# Patient Record
Sex: Female | Born: 1995 | Race: Black or African American | Hispanic: No | Marital: Single | Smoking: Current every day smoker
Health system: Southern US, Community
[De-identification: ages and names within clinical notes are randomized; demographics above are authoritative.]

## PROBLEM LIST (undated history)

## (undated) DIAGNOSIS — Z789 Other specified health status: Secondary | ICD-10-CM

## (undated) DIAGNOSIS — J45909 Unspecified asthma, uncomplicated: Secondary | ICD-10-CM

## (undated) DIAGNOSIS — F419 Anxiety disorder, unspecified: Secondary | ICD-10-CM

## (undated) HISTORY — PX: NO PAST SURGERIES: SHX2092

---
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
## 2014-07-11 ENCOUNTER — Encounter (HOSPITAL_COMMUNITY): Payer: Self-pay | Admitting: *Deleted

## 2014-07-11 ENCOUNTER — Inpatient Hospital Stay (HOSPITAL_COMMUNITY)
Admission: AD | Admit: 2014-07-11 | Discharge: 2014-07-11 | Disposition: A | Discharge status: Home / Self Care | Payer: Self-pay | Source: Ambulatory Visit | Attending: Obstetrics & Gynecology | Admitting: Obstetrics & Gynecology

## 2014-07-11 DIAGNOSIS — N926 Irregular menstruation, unspecified: Secondary | ICD-10-CM | POA: Insufficient documentation

## 2014-07-11 DIAGNOSIS — R109 Unspecified abdominal pain: Secondary | ICD-10-CM

## 2014-07-11 DIAGNOSIS — R52 Pain, unspecified: Secondary | ICD-10-CM | POA: Insufficient documentation

## 2014-07-11 HISTORY — DX: Other specified health status: Z78.9

## 2014-07-11 LAB — URINE MICROSCOPIC-ADD ON

## 2014-07-11 LAB — URINALYSIS, ROUTINE W REFLEX MICROSCOPIC
Bilirubin Urine: NEGATIVE
GLUCOSE, UA: NEGATIVE mg/dL
Ketones, ur: 15 mg/dL — AB
LEUKOCYTES UA: NEGATIVE
Nitrite: NEGATIVE
PH: 6 (ref 5.0–8.0)
PROTEIN: NEGATIVE mg/dL
SPECIFIC GRAVITY, URINE: 1.02 (ref 1.005–1.030)
Urobilinogen, UA: 1 mg/dL (ref 0.0–1.0)

## 2014-07-11 LAB — CBC
HCT: 34.5 % — ABNORMAL LOW (ref 36.0–46.0)
Hemoglobin: 11.5 g/dL — ABNORMAL LOW (ref 12.0–15.0)
MCH: 27.6 pg (ref 26.0–34.0)
MCHC: 33.3 g/dL (ref 30.0–36.0)
MCV: 82.7 fL (ref 78.0–100.0)
PLATELETS: 310 10*3/uL (ref 150–400)
RBC: 4.17 MIL/uL (ref 3.87–5.11)
RDW: 12.2 % (ref 11.5–15.5)
WBC: 5.5 10*3/uL (ref 4.0–10.5)

## 2014-07-11 LAB — WET PREP, GENITAL
Trich, Wet Prep: NONE SEEN
Yeast Wet Prep HPF POC: NONE SEEN

## 2014-07-11 LAB — POCT PREGNANCY, URINE: PREG TEST UR: NEGATIVE

## 2014-07-11 LAB — HCG, SERUM, QUALITATIVE: Preg, Serum: NEGATIVE

## 2014-07-11 MED ORDER — CYCLOBENZAPRINE HCL 10 MG PO TABS
10.0000 mg | ORAL_TABLET | Freq: Once | ORAL | Status: AC
  Administered 2014-07-11: 10 mg via ORAL
  Filled 2014-07-11: qty 1

## 2014-07-11 MED ORDER — GI COCKTAIL ~~LOC~~
30.0000 mL | Freq: Once | ORAL | Status: AC
  Administered 2014-07-11: 30 mL via ORAL
  Filled 2014-07-11: qty 30

## 2014-07-11 MED ORDER — ACETAMINOPHEN 500 MG PO TABS
1000.0000 mg | ORAL_TABLET | Freq: Once | ORAL | Status: AC
  Administered 2014-07-11: 1000 mg via ORAL
  Filled 2014-07-11: qty 2

## 2014-07-11 NOTE — MAU Provider Note (Signed)
History     CSN: [id number]  Arrival date and time: 07/11/14 1153   None     Chief Complaint  Patient presents with  . Possible Pregnancy  . Generalized Body Aches   HPI  RN Garvin Fila, RN Registered Nurse Signed Nursing MAU Note Service date: 07/11/2014 12:30 PM   Patient states she has a history of irregular periods. States she has had negative pregnancy tests at home. Has had lower/mid back pain for 2 days. Has had symptoms of pregnancy over the past few weeks. Denies abdominal pain, bleeding or discharge  Pt is not pregnant and is not using anything for birth control but patient says she is using condoms. Pt has history of irregular periods.  Pt says her abd has been feeling distended and breasts increasing in size. Pt denies constipation or diarrhea or UTI symptoms.   No past medical history on file.  No past surgical history on file.  No family history on file.  History  Substance Use Topics  . Smoking status: Not on file  . Smokeless tobacco: Not on file  . Alcohol Use: Not on file    Allergies: Allergies not on file  No prescriptions prior to admission    Review of Systems  Constitutional: Positive for chills. Negative for fever.  Gastrointestinal: Positive for abdominal pain. Negative for diarrhea and constipation.  Genitourinary: Negative for dysuria, urgency and frequency.  Musculoskeletal: Positive for back pain.   Physical Exam   Blood pressure 130/69, pulse 82, temperature 99.5 F (37.5 C), temperature source Oral, resp. rate 16, height 5\' 3"  (1.6 m), weight 173 lb 6.4 oz (78.654 kg), last menstrual period 07/02/2014, SpO2 99.00%.  Physical Exam  Nursing note and vitals reviewed. Constitutional: She is oriented to person, place, and time. She appears well-developed and well-nourished. No distress.  HENT:  Head: Normocephalic.  Eyes: Pupils are equal, round, and reactive to light.  Neck: Normal range of motion. Neck supple.  Cardiovascular:  Normal rate.   Respiratory: Effort normal.  GI: Soft.  Genitourinary:  Small amount of whtie frothy discharge in vault; uterus NSSC NT; adnexa diffusely tender  Musculoskeletal: Normal range of motion.  Neurological: She is alert and oriented to person, place, and time.  Skin: Skin is warm and dry.  Psychiatric: She has a normal mood and affect.    MAU Course  Procedures Results for orders placed during the hospital encounter of 07/11/14 (from the past 24 hour(s))  URINALYSIS, ROUTINE W REFLEX MICROSCOPIC     Status: Abnormal   Collection Time    07/11/14 12:20 PM      Result Value Ref Range   Color, Urine YELLOW  YELLOW   APPearance CLEAR  CLEAR   Specific Gravity, Urine 1.020  1.005 - 1.030   pH 6.0  5.0 - 8.0   Glucose, UA NEGATIVE  NEGATIVE mg/dL   Hgb urine dipstick TRACE (*) NEGATIVE   Bilirubin Urine NEGATIVE  NEGATIVE   Ketones, ur 15 (*) NEGATIVE mg/dL   Protein, ur NEGATIVE  NEGATIVE mg/dL   Urobilinogen, UA 1.0  0.0 - 1.0 mg/dL   Nitrite NEGATIVE  NEGATIVE   Leukocytes, UA NEGATIVE  NEGATIVE  URINE MICROSCOPIC-ADD ON     Status: Abnormal   Collection Time    07/11/14 12:20 PM      Result Value Ref Range   Squamous Epithelial / LPF FEW (*) RARE   WBC, UA 0-2  <3 WBC/hpf   RBC / HPF 3-6  <  3 RBC/hpf   Bacteria, UA FEW (*) RARE   Urine-Other MUCOUS PRESENT    POCT PREGNANCY, URINE     Status: None   Collection Time    07/11/14  1:00 PM      Result Value Ref Range   Preg Test, Ur NEGATIVE  NEGATIVE  CBC     Status: Abnormal   Collection Time    07/11/14  3:43 PM      Result Value Ref Range   WBC 5.5  4.0 - 10.5 K/uL   RBC 4.17  3.87 - 5.11 MIL/uL   Hemoglobin 11.5 (*) 12.0 - 15.0 g/dL   HCT 96.034.5 (*) 45.436.0 - 09.846.0 %   MCV 82.7  78.0 - 100.0 fL   MCH 27.6  26.0 - 34.0 pg   MCHC 33.3  30.0 - 36.0 g/dL   RDW 11.912.2  14.711.5 - 82.915.5 %   Platelets 310  150 - 400 K/uL  HCG, SERUM, QUALITATIVE     Status: None   Collection Time    07/11/14  3:43 PM      Result Value  Ref Range   Preg, Serum NEGATIVE  NEGATIVE  WET PREP, GENITAL     Status: Abnormal   Collection Time    07/11/14  5:38 PM      Result Value Ref Range   Yeast Wet Prep HPF POC NONE SEEN  NONE SEEN   Trich, Wet Prep NONE SEEN  NONE SEEN   Clue Cells Wet Prep HPF POC FEW (*) NONE SEEN   WBC, Wet Prep HPF POC FEW (*) NONE SEEN     Assessment and Plan  Abdominal pain- d/c home F/u in [facility] for irregular menses  Sonya Lindsey 07/11/2014, 1:15 PM

## 2014-07-11 NOTE — Discharge Instructions (Signed)
Abdominal Pain, Women °Abdominal (stomach, pelvic, or belly) pain can be caused by many things. It is important to tell your doctor: °· The location of the pain. °· Does it come and go or is it present all the time? °· Are there things that start the pain (eating certain foods, exercise)? °· Are there other symptoms associated with the pain (fever, nausea, vomiting, diarrhea)? °All of this is helpful to know when trying to find the cause of the pain. °CAUSES  °· Stomach: virus or bacteria infection, or ulcer. °· Intestine: appendicitis (inflamed appendix), regional ileitis (Crohn's disease), ulcerative colitis (inflamed colon), irritable bowel syndrome, diverticulitis (inflamed diverticulum of the colon), or cancer of the stomach or intestine. °· Gallbladder disease or stones in the gallbladder. °· Kidney disease, kidney stones, or infection. °· Pancreas infection or cancer. °· Fibromyalgia (pain disorder). °· Diseases of the female organs: °¨ Uterus: fibroid (non-cancerous) tumors or infection. °¨ Fallopian tubes: infection or tubal pregnancy. °¨ Ovary: cysts or tumors. °¨ Pelvic adhesions (scar tissue). °¨ Endometriosis (uterus lining tissue growing in the pelvis and on the pelvic organs). °¨ Pelvic congestion syndrome (female organs filling up with blood just before the menstrual period). °¨ Pain with the menstrual period. °¨ Pain with ovulation (producing an egg). °¨ Pain with an IUD (intrauterine device, birth control) in the uterus. °¨ Cancer of the female organs. °· Functional pain (pain not caused by a disease, may improve without treatment). °· Psychological pain. °· Depression. °DIAGNOSIS  °Your doctor will decide the seriousness of your pain by doing an examination. °· Blood tests. °· X-rays. °· Ultrasound. °· CT scan (computed tomography, special type of X-ray). °· MRI (magnetic resonance imaging). °· Cultures, for infection. °· Barium enema (dye inserted in the large intestine, to better view it with  X-rays). °· Colonoscopy (looking in intestine with a lighted tube). °· Laparoscopy (minor surgery, looking in abdomen with a lighted tube). °· Major abdominal exploratory surgery (looking in abdomen with a large incision). °TREATMENT  °The treatment will depend on the cause of the pain.  °· Many cases can be observed and treated at home. °· Over-the-counter medicines recommended by your caregiver. °· Prescription medicine. °· Antibiotics, for infection. °· Birth control pills, for painful periods or for ovulation pain. °· Hormone treatment, for endometriosis. °· Nerve blocking injections. °· Physical therapy. °· Antidepressants. °· Counseling with a psychologist or psychiatrist. °· Minor or major surgery. °HOME CARE INSTRUCTIONS  °· Do not take laxatives, unless directed by your caregiver. °· Take over-the-counter pain medicine only if ordered by your caregiver. Do not take aspirin because it can cause an upset stomach or bleeding. °· Try a clear liquid diet (broth or water) as ordered by your caregiver. Slowly move to a bland diet, as tolerated, if the pain is related to the stomach or intestine. °· Have a thermometer and take your temperature several times a day, and record it. °· Bed rest and sleep, if it helps the pain. °· Avoid sexual intercourse, if it causes pain. °· Avoid stressful situations. °· Keep your follow-up appointments and tests, as your caregiver orders. °· If the pain does not go away with medicine or surgery, you may try: °¨ Acupuncture. °¨ Relaxation exercises (yoga, meditation). °¨ Group therapy. °¨ Counseling. °SEEK MEDICAL CARE IF:  °· You notice certain foods cause stomach pain. °· Your home care treatment is not helping your pain. °· You need stronger pain medicine. °· You want your IUD removed. °· You feel faint or   lightheaded.  You develop nausea and vomiting.  You develop a rash.  You are having side effects or an allergy to your medicine. SEEK IMMEDIATE MEDICAL CARE IF:   Your  pain does not go away or gets worse.  You have a fever.  Your pain is felt only in portions of the abdomen. The right side could possibly be appendicitis. The left lower portion of the abdomen could be colitis or diverticulitis.  You are passing blood in your stools (bright red or black tarry stools, with or without vomiting).  You have blood in your urine.  You develop chills, with or without a fever.  You pass out. MAKE SURE YOU:   Understand these instructions.  Will watch your condition.  Will get help right away if you are not doing well or get worse. Document Released: 09/19/2007 Document Revised: 04/08/2014 Document Reviewed: 10/09/2009 [facility] Patient Information 2015 [place], [place]. This information is not intended to replace advice given to you by your health care provider. Make sure you discuss any questions you have with your health care provider.  Abdominal Pain, Women Abdominal (stomach, pelvic, or belly) pain can be caused by many things. It is important to tell your doctor:  The location of the pain.  Does it come and go or is it present all the time?  Are there things that start the pain (eating certain foods, exercise)?  Are there other symptoms associated with the pain (fever, nausea, vomiting, diarrhea)? All of this is helpful to know when trying to find the cause of the pain. CAUSES   Stomach: virus or bacteria infection, or ulcer.  Intestine: appendicitis (inflamed appendix), regional ileitis (Crohn's disease), ulcerative colitis (inflamed colon), irritable bowel syndrome, diverticulitis (inflamed diverticulum of the colon), or cancer of the stomach or intestine.  Gallbladder disease or stones in the gallbladder.  Kidney disease, kidney stones, or infection.  Pancreas infection or cancer.  Fibromyalgia (pain disorder).  Diseases of the female organs:  Uterus: fibroid (non-cancerous) tumors or infection.  Fallopian tubes: infection or tubal  pregnancy.  Ovary: cysts or tumors.  Pelvic adhesions (scar tissue).  Endometriosis (uterus lining tissue growing in the pelvis and on the pelvic organs).  Pelvic congestion syndrome (female organs filling up with blood just before the menstrual period).  Pain with the menstrual period.  Pain with ovulation (producing an egg).  Pain with an IUD (intrauterine device, birth control) in the uterus.  Cancer of the female organs.  Functional pain (pain not caused by a disease, may improve without treatment).  Psychological pain.  Depression. DIAGNOSIS  Your doctor will decide the seriousness of your pain by doing an examination.  Blood tests.  X-rays.  Ultrasound.  CT scan (computed tomography, special type of X-ray).  MRI (magnetic resonance imaging).  Cultures, for infection.  Barium enema (dye inserted in the large intestine, to better view it with X-rays).  Colonoscopy (looking in intestine with a lighted tube).  Laparoscopy (minor surgery, looking in abdomen with a lighted tube).  Major abdominal exploratory surgery (looking in abdomen with a large incision). TREATMENT  The treatment will depend on the cause of the pain.   Many cases can be observed and treated at home.  Over-the-counter medicines recommended by your caregiver.  Prescription medicine.  Antibiotics, for infection.  Birth control pills, for painful periods or for ovulation pain.  Hormone treatment, for endometriosis.  Nerve blocking injections.  Physical therapy.  Antidepressants.  Counseling with a psychologist or psychiatrist.  Minor or  major surgery. °HOME CARE INSTRUCTIONS  °· Do not take laxatives, unless directed by your caregiver. °· Take over-the-counter pain medicine only if ordered by your caregiver. Do not take aspirin because it can cause an upset stomach or bleeding. °· Try a clear liquid diet (broth or water) as ordered by your caregiver. Slowly move to a bland diet, as  tolerated, if the pain is related to the stomach or intestine. °· Have a thermometer and take your temperature several times a day, and record it. °· Bed rest and sleep, if it helps the pain. °· Avoid sexual intercourse, if it causes pain. °· Avoid stressful situations. °· Keep your follow-up appointments and tests, as your caregiver orders. °· If the pain does not go away with medicine or surgery, you may try: °¨ Acupuncture. °¨ Relaxation exercises (yoga, meditation). °¨ Group therapy. °¨ Counseling. °SEEK MEDICAL CARE IF:  °· You notice certain foods cause stomach pain. °· Your home care treatment is not helping your pain. °· You need stronger pain medicine. °· You want your IUD removed. °· You feel faint or lightheaded. °· You develop nausea and vomiting. °· You develop a rash. °· You are having side effects or an allergy to your medicine. °SEEK IMMEDIATE MEDICAL CARE IF:  °· Your pain does not go away or gets worse. °· You have a fever. °· Your pain is felt only in portions of the abdomen. The right side could possibly be appendicitis. The left lower portion of the abdomen could be colitis or diverticulitis. °· You are passing blood in your stools (bright red or black tarry stools, with or without vomiting). °· You have blood in your urine. °· You develop chills, with or without a fever. °· You pass out. °MAKE SURE YOU:  °· Understand these instructions. °· Will watch your condition. °· Will get help right away if you are not doing well or get worse. °Document Released: 09/19/2007 Document Revised: 04/08/2014 Document Reviewed: 10/09/2009 °[employer]® Patient Information ©2015 [employer]. This information is not intended to replace advice given to you by your health care provider. Make sure you discuss any questions you have with your health care provider. ° °

## 2014-07-11 NOTE — MAU Note (Signed)
Patient states she has a history of irregular periods. States she has had negative pregnancy tests at home. Has had lower/mid back pain for 2 days. Has had symptoms of pregnancy over the past few weeks. Denies abdominal pain, bleeding or discharge.

## 2014-07-12 LAB — GC/CHLAMYDIA PROBE AMP
CT Probe RNA: NEGATIVE
GC PROBE AMP APTIMA: NEGATIVE

## 2014-07-12 LAB — RPR

## 2014-08-01 ENCOUNTER — Encounter (HOSPITAL_COMMUNITY): Payer: Self-pay | Admitting: Emergency Medicine

## 2014-08-01 ENCOUNTER — Emergency Department (HOSPITAL_COMMUNITY)
Admission: EM | Admit: 2014-08-01 | Discharge: 2014-08-01 | Disposition: A | Discharge status: Home / Self Care | Payer: Self-pay | Attending: Emergency Medicine | Admitting: Emergency Medicine

## 2014-08-01 DIAGNOSIS — F17200 Nicotine dependence, unspecified, uncomplicated: Secondary | ICD-10-CM | POA: Insufficient documentation

## 2014-08-01 DIAGNOSIS — Z79899 Other long term (current) drug therapy: Secondary | ICD-10-CM | POA: Insufficient documentation

## 2014-08-01 DIAGNOSIS — Z3202 Encounter for pregnancy test, result negative: Secondary | ICD-10-CM | POA: Insufficient documentation

## 2014-08-01 DIAGNOSIS — R109 Unspecified abdominal pain: Secondary | ICD-10-CM | POA: Insufficient documentation

## 2014-08-01 LAB — URINALYSIS, ROUTINE W REFLEX MICROSCOPIC
Bilirubin Urine: NEGATIVE
GLUCOSE, UA: NEGATIVE mg/dL
HGB URINE DIPSTICK: NEGATIVE
Ketones, ur: NEGATIVE mg/dL
Leukocytes, UA: NEGATIVE
Nitrite: NEGATIVE
PH: 5.5 (ref 5.0–8.0)
Protein, ur: NEGATIVE mg/dL
SPECIFIC GRAVITY, URINE: 1.016 (ref 1.005–1.030)
UROBILINOGEN UA: 0.2 mg/dL (ref 0.0–1.0)

## 2014-08-01 LAB — CBC WITH DIFFERENTIAL/PLATELET
BASOS ABS: 0 10*3/uL (ref 0.0–0.1)
BASOS PCT: 1 % (ref 0–1)
Eosinophils Absolute: 0.3 10*3/uL (ref 0.0–0.7)
Eosinophils Relative: 6 % — ABNORMAL HIGH (ref 0–5)
HCT: 35.6 % — ABNORMAL LOW (ref 36.0–46.0)
HEMOGLOBIN: 11.7 g/dL — AB (ref 12.0–15.0)
LYMPHS PCT: 55 % — AB (ref 12–46)
Lymphs Abs: 2.7 10*3/uL (ref 0.7–4.0)
MCH: 27.5 pg (ref 26.0–34.0)
MCHC: 32.9 g/dL (ref 30.0–36.0)
MCV: 83.8 fL (ref 78.0–100.0)
Monocytes Absolute: 0.3 10*3/uL (ref 0.1–1.0)
Monocytes Relative: 6 % (ref 3–12)
NEUTROS ABS: 1.6 10*3/uL — AB (ref 1.7–7.7)
NEUTROS PCT: 32 % — AB (ref 43–77)
Platelets: 329 10*3/uL (ref 150–400)
RBC: 4.25 MIL/uL (ref 3.87–5.11)
RDW: 12.3 % (ref 11.5–15.5)
WBC: 4.9 10*3/uL (ref 4.0–10.5)

## 2014-08-01 LAB — COMPREHENSIVE METABOLIC PANEL
ALBUMIN: 3.6 g/dL (ref 3.5–5.2)
ALK PHOS: 78 U/L (ref 39–117)
ALT: 15 U/L (ref 0–35)
AST: 18 U/L (ref 0–37)
Anion gap: 13 (ref 5–15)
BILIRUBIN TOTAL: 0.6 mg/dL (ref 0.3–1.2)
BUN: 11 mg/dL (ref 6–23)
CHLORIDE: 102 meq/L (ref 96–112)
CO2: 26 meq/L (ref 19–32)
Calcium: 9 mg/dL (ref 8.4–10.5)
Creatinine, Ser: 0.74 mg/dL (ref 0.50–1.10)
GFR calc Af Amer: >90 mL/min (ref >90)
GFR calc non Af Amer: >90 mL/min (ref >90)
Glucose, Bld: 96 mg/dL (ref 70–99)
POTASSIUM: 3.8 meq/L (ref 3.7–5.3)
SODIUM: 141 meq/L (ref 137–147)
Total Protein: 7.3 g/dL (ref 6.0–8.3)

## 2014-08-01 LAB — LIPASE, BLOOD: LIPASE: 21 U/L (ref 11–59)

## 2014-08-01 LAB — POC URINE PREG, ED: Preg Test, Ur: NEGATIVE

## 2014-08-01 NOTE — ED Provider Notes (Signed)
CSN: [id number]     Arrival date & time 08/01/14  0234 History   First MD Initiated Contact with Patient 08/01/14 [phone]     Chief Complaint  Patient presents with  . Abdominal Pain     (Consider location/radiation/quality/duration/timing/severity/associated sxs/prior Treatment) HPI Comments: 18 year old female who is otherwise healthy, no past surgical history presents with a complaint of abdominal distention and feeling "heavy". She denies abdominal pain, denies nausea vomiting diarrhea dysuria rectal bleeding or hematuria. She states that approximately 2 months ago she started to notice that her abdomen felt "heavy". This evening when she awoke she felt that it was more significant than usual but again denies that this is a painful sensation. Nothing makes this better or worse, there is no pain related to eating  Patient is a 18 y.o. female presenting with abdominal pain. The history is provided by the patient.  Abdominal Pain   Past Medical History  Diagnosis Date  . Medical history non-contributory    Past Surgical History  Procedure Laterality Date  . No past surgeries     History reviewed. No pertinent family history. History  Substance Use Topics  . Smoking status: Current Every Day Smoker -- 0.25 packs/day  . Smokeless tobacco: Not on file  . Alcohol Use: No   OB History   Grav Para Term Preterm Abortions TAB SAB Ect Mult Living   0         0     Review of Systems  Gastrointestinal: Positive for abdominal pain.  All other systems reviewed and are negative.     Allergies  Review of patient's allergies indicates no known allergies.  Home Medications   Prior to Admission medications   Medication Sig Start Date End Date Taking? Authorizing Provider  folic acid (FOLVITE) 400 MCG tablet Take 400 mcg by mouth daily.   Yes Historical Provider, MD  Prenatal Vit-Fe Fumarate-FA (PRENATAL MULTIVITAMIN) TABS tablet Take 1 tablet by mouth daily at 12 noon.   Yes Historical  Provider, MD   BP 129/73  Pulse 66  Resp 19  SpO2 100%  LMP 07/02/2014 Physical Exam  Nursing note and vitals reviewed. Constitutional: She appears well-developed and well-nourished. No distress.  HENT:  Head: Normocephalic and atraumatic.  Mouth/Throat: Oropharynx is clear and moist. No oropharyngeal exudate.  Eyes: Conjunctivae and EOM are normal. Pupils are equal, round, and reactive to light. Right eye exhibits no discharge. Left eye exhibits no discharge. No scleral icterus.  Neck: Normal range of motion. Neck supple. No JVD present. No thyromegaly present.  Cardiovascular: Normal rate, regular rhythm, normal heart sounds and intact distal pulses.  Exam reveals no gallop and no friction rub.   No murmur heard. Pulmonary/Chest: Effort normal and breath sounds normal. No respiratory distress. She has no wheezes. She has no rales.  Abdominal: Soft. Bowel sounds are normal. She exhibits no distension and no mass. There is no tenderness.  Musculoskeletal: Normal range of motion. She exhibits no edema and no tenderness.  Lymphadenopathy:    She has no cervical adenopathy.  Neurological: She is alert. Coordination normal.  Skin: Skin is warm and dry. No rash noted. No erythema.  Psychiatric: She has a normal mood and affect. Her behavior is normal.    ED Course  Procedures (including critical care time) Labs Review Labs Reviewed  CBC WITH DIFFERENTIAL - Abnormal; Notable for the following:    Hemoglobin 11.7 (*)    HCT 35.6 (*)    Neutrophils Relative % 32 (*)  Neutro Abs 1.6 (*)    Lymphocytes Relative 55 (*)    Eosinophils Relative 6 (*)    All other components within normal limits  URINALYSIS, ROUTINE W REFLEX MICROSCOPIC - Abnormal; Notable for the following:    APPearance HAZY (*)    All other components within normal limits  COMPREHENSIVE METABOLIC PANEL  LIPASE, BLOOD  POC URINE PREG, ED    Imaging Review No results found.    MDM   Final diagnoses:   Abdominal discomfort    The patient has a completely nontender and benign abdomen, she is overweight but her abdomen is very soft, nonfocal, nontender and there are no masses. She has no tympanitic sounds to percussion, normal bowel sounds and with a normal appetite I doubt that this is related to a bowel obstruction, there is no ascites, doubt appendicitis, kidney stone and without other GI symptoms doubt inflammatory bowel disease. Nursing had ordered labs, anticipate discharge.  Laboratory workup is completely normal without signs of transaminitis, pancreatitis or leukocytosis. Not pregnant, and a urine infection, stable for discharge  Vida Roller, MD 08/01/14 [phone]

## 2014-08-01 NOTE — ED Notes (Signed)
Pt monitored by pulse ox, bp cuff, and 5-lead.  Pt monitored by 5-lead because pt states that she has been short of breath recently.

## 2014-08-01 NOTE — ED Notes (Signed)
Patient reports having abdominal pain in right side for over a month.  She is concerned she is pregnant. No nausea or vomiting. No diarrhea, but complains of constipation.

## 2014-08-01 NOTE — Discharge Instructions (Signed)
Abdominal Pain °Many things can cause abdominal pain. Usually, abdominal pain is not caused by a disease and will improve without treatment. It can often be observed and treated at home. Your health care provider will do a physical exam and possibly order blood tests and X-rays to help determine the seriousness of your pain. However, in many cases, more time must pass before a clear cause of the pain can be found. Before that point, your health care provider may not know if you need more testing or further treatment. °HOME CARE INSTRUCTIONS  °Monitor your abdominal pain for any changes. The following actions may help to alleviate any discomfort you are experiencing: °· Only take over-the-counter or prescription medicines as directed by your health care provider. °· Do not take laxatives unless directed to do so by your health care provider. °· Try a clear liquid diet (broth, tea, or water) as directed by your health care provider. Slowly move to a bland diet as tolerated. °SEEK MEDICAL CARE IF: °· You have unexplained abdominal pain. °· You have abdominal pain associated with nausea or diarrhea. °· You have pain when you urinate or have a bowel movement. °· You experience abdominal pain that wakes you in the night. °· You have abdominal pain that is worsened or improved by eating food. °· You have abdominal pain that is worsened with eating fatty foods. °· You have a fever. °SEEK IMMEDIATE MEDICAL CARE IF:  °· Your pain does not go away within 2 hours. °· You keep throwing up (vomiting). °· Your pain is felt only in portions of the abdomen, such as the right side or the left lower portion of the abdomen. °· You pass bloody or black tarry stools. °MAKE SURE YOU: °· Understand these instructions.   °· Will watch your condition.   °· Will get help right away if you are not doing well or get worse.   °Document Released: 09/01/2005 Document Revised: 11/27/2013 Document Reviewed: 08/01/2013 °ExitCare® Patient Information  ©2015 Exit[employer], LLC. This information is not intended to replace advice given to you by your health care provider. Make sure you discuss any questions you have with your health care provider. ° ° °Emergency Department Resource Guide °1) Find a Doctor and Pay Out of Pocket °Although you won't have to find out who is covered by your insurance plan, it is a good idea to ask around and get recommendations. You will then need to call the office and see if the doctor you have chosen will accept you as a new patient and what types of options they offer for patients who are self-pay. Some doctors offer discounts or will set up payment plans for their patients who do not have insurance, but you will need to ask so you aren't surprised when you get to your appointment. ° °2) Contact Your Local Health Department °Not all health departments have doctors that can see patients for sick visits, but many do, so it is worth a call to see if yours does. If you don't know where your local health department is, you can check in your phone book. The CDC also has a tool to help you locate your state's health department, and many state websites also have listings of all of their local health departments. ° °3) Find a Walk-in Clinic °If your illness is not likely to be very severe or complicated, you may want to try a walk in clinic. These are popping up all over the country in pharmacies, drugstores, and shopping centers. They're   usually staffed by nurse practitioners or physician assistants that have been trained to treat common illnesses and complaints. They're usually fairly quick and inexpensive. However, if you have serious medical issues or chronic medical problems, these are probably not your best option. ° °No Primary Care Doctor: °- Call [facility] at  832-8000 - they can help you locate a primary care doctor that  accepts your insurance, provides certain services, etc. °- Physician Referral Service- 1-800-533-3463 ° °Chronic  Pain Problems: °Organization         Address  Phone   Notes  °[facility]  (336) 297-2271 Patients need to be referred by their primary care doctor.  ° °Medication Assistance: °Organization         Address  Phone   Notes  °[place] [facility] [address] °[place], [place] [address] (336) 641-8030 --Must be a resident of [place] °-- Must have NO insurance coverage whatsoever (no Medicaid/ Medicare, etc.) °-- The pt. MUST have a primary care doctor that directs their care regularly and follows them in the community °  °[facility]  (866) 331-1348   °[employer]  (888) 892-1162   ° °Agencies that provide inexpensive medical care: °Organization         Address  Phone   Notes  °[facility]  (336) 832-8035   °[facility]    (336) 832-7272   °[facility] [address] °[place], [place] [address] (336) 832-4777   °[facility] [address], °[place] (336) 271-4999   °[facility]    (336) 373-0678   °[facility]    (336) 272-1050   °[facility] ° [address], [place] Phone:  (336) 832-4444, Fax:  (336) 832-4440 Hours of Operation:  9 am - 6 pm, M-F.  Also accepts Medicaid/Medicare and self-pay.  °[facility] ° [address], [place] Phone: (336) 832-3150, Fax: (336) 832-3151. Hours of Operation:  8:30 am - 5:30 pm, M-F.  Also accepts Medicaid and self-pay.  °[facility] [place] [address], [place] Phone: (336) 878-6027   °[facility] [address], [place], [place] (336)723-1848, Ext. 123 Mondays & Thursdays: 7-9 AM.  First 15 patients are seen on a first come, first serve basis. °  ° °Medicaid-accepting [place] Providers: ° °Organization         Address  Phone   Notes  °[facility] [address], [place] (336) 641-2100 Also  accepts self-pay patients.  °[facility] [address], [place] ° (336) 856-9996   °[facility] [address], [place] (336) 288-8857   °[facility] [address] [place] [address], [place] (336) 299-7000   °Veita Bland [address], [place]  ° (336) 373-1557 Only accepts [place] Access Medicaid patients after they have their name applied to their card.  ° °Self-Pay (no insurance) in [place]: ° °Organization         Address  Phone   Notes  °Sickle Cell Patients, [facility] [address], [place] (336) 832-1970   °[facility] [address], [place] (336) 832-4400   °[facility] ° [address],  (336) 992-4800   °[facility]/Dr. Osei-Bonsu ° [address] [place] [address], [place] or [address], [place] (336) [phone] Phone number for both [place] and [place] locations is the same.  °[facility] [address], [place] (336) 299-0000   °[facility] [address] [place] [address], [place] or [address] (336) 852-7530 °(336) 878-2260   °[facility] [address], [place] (336) 350-1642, phone; (336) 294-5005, fax Sees patients 1st and 3rd Saturday of every month.  Must not qualify for public or private insurance (i.e. Medicaid, Medicare, [place] Health Choice, Veterans' Benefits) • Household income should be no more than 200% of the poverty level •The clinic cannot treat you if you are pregnant or think you are pregnant • Sexually transmitted diseases are not treated at the clinic.  ° ° °Dental Care: °Organization         Address  Phone  Notes  °[facility] [facility] [address], [place] (336) 641-6152 Accepts children up to age 21 who are enrolled in Medicaid or [place] Health Choice; pregnant  women with a Medicaid card; and children who have applied for Medicaid or [place] Health Choice, but were declined, whose parents can pay a reduced fee at time of service.  °[facility] [place]  [address], [place] (336) 641-7733 Accepts children up to age 21 who are enrolled in Medicaid or [place] Health Choice; pregnant women with a Medicaid card; and children who have applied for Medicaid or [place] Health Choice, but were declined, whose parents can pay a reduced fee at time of service.  °[facility] ° [address], [place] (336) 641-4533 Patients are seen by appointment only. Walk-ins are not accepted. [facility] will see patients 18 years of age and older. °Monday - Tuesday (8am-5pm) °Most Wednesdays (8:30-5pm) °$30 per visit, cash only  °[facility] ° [address], [place] (336) 641-4533 Patients are seen by appointment only. Walk-ins are not accepted. [facility] will see patients 18 years of age and older. °One Wednesday Evening (Monthly: Volunteer Based).  $30 per visit, cash only  °[facility]  (919) 537-3737 for adults; Children under age 4, call Graduate Pediatric Dentistry at ([phone]) [phone]. Children aged 4-14, please call ([phone]) [phone] to request a pediatric application. ° Dental services are provided in all areas of dental care including fillings, crowns and bridges, complete and partial dentures, implants, gum treatment, root canals, and extractions. Preventive care is also provided. Treatment is provided to both adults and children. °Patients are selected via a lottery and there is often a waiting list. °  °[facility] [address], ° ° (336) 763-8833 [email or web address] °  °[facility] [address], [place], [place] (336)723-1848, Ext. 123 Second and Fourth Thursday of each month, opens at 6:30 AM; Clinic ends at 9 AM.  Patients are  seen on a first-come first-served basis, and a limited number are seen during each clinic.  ° °[facility] ° [address], [place],  (336) 723-7904   Eligibility Requirements °You must have lived in [place], [place], or [place] counties for at least the last three months. °  You cannot be eligible for state or federal sponsored healthcare insurance, including Veterans Administration, Medicaid, or Medicare. °  You generally cannot be eligible for healthcare insurance through   your employer.  °  How to apply: °Eligibility screenings are held every Tuesday and Wednesday afternoon from 1:00 pm until 4:00 pm. You do not need an appointment for the interview!  °[facility] [address], [place], [place] 336-631-2330   °[facility]  336-342-8273   °[facility]  336-703-3100   °[facility]  336-570-6415   ° °Behavioral Health Resources in the Community: °Intensive Outpatient Programs °Organization         Address  Phone  Notes  °[facility] [address], [place], [place] 336-878-6098   °[facility] [address], [place], [place] 336-832-9800   °[facility] [address], [place], [place] ° 336-882-2125   °[facility] [address],  °[place], [place] 1-800-853-5163 or 336-641-4981   °Substance Abuse Resources °Organization         Address  Phone  Notes  °[facility]  336-882-2125   °[facility]  336-784-9470   °[facility]  336-285-9073   °[facility]  336-845-3988   °Residential & Outpatient Substance Abuse Program  1-800-659-3381   °Psychological Services °Organization         Address  Phone  Notes  °[facility]  336- 832-9600   °[facility]  336- 378-7881   °[facility] [address], [place] 1-800-853-5163 or 336-641-4981   ° °Mobile Crisis  Teams °Organization         Address  Phone  Notes  °[facility], Mobile Crisis Care Unit  1-877-626-1772   °Assertive °[facility] ° [address] [place], [place] 336-834-9664   °Sharon DeEsch [address] °[place] 336-554-5454   ° °Self-Help/Support Groups °Organization         Address  Phone             Notes  °[facility] - variety of support groups  336- 373-1402 Call for more information  °Narcotics Anonymous (NA), [facility] [address], °[place]  2 meetings at this location  ° °Residential Treatment Programs °Organization         Address  Phone  Notes  °[facility] [address],    °[place]  1-866-801-8205   °[facility] ° [address], [place], [place] 704-293-8524   °[facility] [address], [place] 336-845-3988 Admissions: 8am-3pm M-F  °[facility] [address]    °[place], [place] 336-841-1104   °[facility] [address], [place], [place] 336-379-7146   °[facility] [address]  °[place], [place] 336-285-9073   °[facility] Intensive Outpatient [address], [place], [place] 336-852-3033   °[facility] [address]  °[place], [place] 1-877-615-2722 or 336-784-9470   °[facility] [address] [place], [place] 336-227-7417 Accepts Medicaid  °[facility] [address]  ° [place] 1-800-659-3381 Substance Abuse/Addiction Treatment  ° °[place] Behavioral Health Resources °Organization         Address  Phone  Notes  °[facility]  (888) 581-9988   °Julie Brannon, PhD [address] [place], [place]   (336) 349-5553 or (336) 951-0000   °[facility]   [address] °, [place] (336) 349-4454   °  [facility] [address], [place], [place] (336) 342-8316  Insurance/Medicaid/sponsorship through [facility]  °[facility] [address]                                    [place], [place] (336) 342-8316 Therapy/tele-psych/case  °[facility] [address]  ° [place], [place] (336) 349-2233    °Dr. Arfeen  (336) 349-4544   °[facility] [place]  [employer] [place] [facility] 1) [address], [place] °2) [address], [place] °3)  [address], [place] (336) 349-3220 °(336) 342-7768 ° °(336) 342-8140   °[place] [facility] (336) 342-1394 or (336) 342-3537 (After Hours)    ° ° ° ° °

## 2014-09-28 ENCOUNTER — Emergency Department (HOSPITAL_COMMUNITY): Payer: Self-pay

## 2014-09-28 ENCOUNTER — Emergency Department (HOSPITAL_COMMUNITY)
Admission: EM | Admit: 2014-09-28 | Discharge: 2014-09-28 | Disposition: A | Discharge status: Home / Self Care | Payer: Self-pay | Attending: Emergency Medicine | Admitting: Emergency Medicine

## 2014-09-28 ENCOUNTER — Encounter (HOSPITAL_COMMUNITY): Payer: Self-pay | Admitting: Emergency Medicine

## 2014-09-28 DIAGNOSIS — Z720 Tobacco use: Secondary | ICD-10-CM | POA: Insufficient documentation

## 2014-09-28 DIAGNOSIS — N644 Mastodynia: Secondary | ICD-10-CM | POA: Insufficient documentation

## 2014-09-28 DIAGNOSIS — Z79899 Other long term (current) drug therapy: Secondary | ICD-10-CM | POA: Insufficient documentation

## 2014-09-28 DIAGNOSIS — Z3202 Encounter for pregnancy test, result negative: Secondary | ICD-10-CM | POA: Insufficient documentation

## 2014-09-28 DIAGNOSIS — R109 Unspecified abdominal pain: Secondary | ICD-10-CM | POA: Insufficient documentation

## 2014-09-28 LAB — COMPREHENSIVE METABOLIC PANEL
ALK PHOS: 66 U/L (ref 39–117)
ALT: 9 U/L (ref 0–35)
AST: 13 U/L (ref 0–37)
Albumin: 3.9 g/dL (ref 3.5–5.2)
Anion gap: 9 (ref 5–15)
BUN: 10 mg/dL (ref 6–23)
CALCIUM: 9.2 mg/dL (ref 8.4–10.5)
CO2: 26 meq/L (ref 19–32)
Chloride: 101 mEq/L (ref 96–112)
Creatinine, Ser: 0.74 mg/dL (ref 0.50–1.10)
GLUCOSE: 65 mg/dL — AB (ref 70–99)
POTASSIUM: 3.8 meq/L (ref 3.7–5.3)
SODIUM: 136 meq/L — AB (ref 137–147)
Total Bilirubin: 0.7 mg/dL (ref 0.3–1.2)
Total Protein: 7.8 g/dL (ref 6.0–8.3)

## 2014-09-28 LAB — URINALYSIS, ROUTINE W REFLEX MICROSCOPIC
BILIRUBIN URINE: NEGATIVE
GLUCOSE, UA: NEGATIVE mg/dL
HGB URINE DIPSTICK: NEGATIVE
KETONES UR: NEGATIVE mg/dL
Leukocytes, UA: NEGATIVE
Nitrite: NEGATIVE
PROTEIN: NEGATIVE mg/dL
Specific Gravity, Urine: 1.004 — ABNORMAL LOW (ref 1.005–1.030)
Urobilinogen, UA: 0.2 mg/dL (ref 0.0–1.0)
pH: 7.5 (ref 5.0–8.0)

## 2014-09-28 LAB — CBC WITH DIFFERENTIAL/PLATELET
Basophils Absolute: 0 10*3/uL (ref 0.0–0.1)
Basophils Relative: 0 % (ref 0–1)
EOS PCT: 4 % (ref 0–5)
Eosinophils Absolute: 0.2 10*3/uL (ref 0.0–0.7)
HCT: 35.1 % — ABNORMAL LOW (ref 36.0–46.0)
HEMOGLOBIN: 11.8 g/dL — AB (ref 12.0–15.0)
LYMPHS ABS: 2.2 10*3/uL (ref 0.7–4.0)
LYMPHS PCT: 44 % (ref 12–46)
MCH: 28 pg (ref 26.0–34.0)
MCHC: 33.6 g/dL (ref 30.0–36.0)
MCV: 83.2 fL (ref 78.0–100.0)
Monocytes Absolute: 0.6 10*3/uL (ref 0.1–1.0)
Monocytes Relative: 11 % (ref 3–12)
Neutro Abs: 2 10*3/uL (ref 1.7–7.7)
Neutrophils Relative %: 41 % — ABNORMAL LOW (ref 43–77)
PLATELETS: 319 10*3/uL (ref 150–400)
RBC: 4.22 MIL/uL (ref 3.87–5.11)
RDW: 12.5 % (ref 11.5–15.5)
WBC: 5 10*3/uL (ref 4.0–10.5)

## 2014-09-28 LAB — POC URINE PREG, ED: Preg Test, Ur: NEGATIVE

## 2014-09-28 LAB — CBG MONITORING, ED: Glucose-Capillary: 73 mg/dL (ref 70–99)

## 2014-09-28 MED ORDER — GLUCOSE 40 % PO GEL
1.0000 | Freq: Once | ORAL | Status: DC
  Filled 2014-09-28: qty 1

## 2014-09-28 MED ORDER — FAMOTIDINE 20 MG PO TABS: 20.0000 mg | ORAL_TABLET | Freq: Two times a day (BID) | ORAL | Status: AC

## 2014-09-28 NOTE — Discharge Instructions (Signed)
Abdominal Pain, Women Abdominal (stomach, pelvic, or belly) pain can be caused by many things. It is important to tell your doctor:  The location of the pain.  Does it come and go or is it present all the time?  Are there things that start the pain (eating certain foods, exercise)?  Are there other symptoms associated with the pain (fever, nausea, vomiting, diarrhea)? All of this is helpful to know when trying to find the cause of the pain. CAUSES   Stomach: virus or bacteria infection, or ulcer.  Intestine: appendicitis (inflamed appendix), regional ileitis (Crohn's disease), ulcerative colitis (inflamed colon), irritable bowel syndrome, diverticulitis (inflamed diverticulum of the colon), or cancer of the stomach or intestine.  Gallbladder disease or stones in the gallbladder.  Kidney disease, kidney stones, or infection.  Pancreas infection or cancer.  Fibromyalgia (pain disorder).  Diseases of the female organs:  Uterus: fibroid (non-cancerous) tumors or infection.  Fallopian tubes: infection or tubal pregnancy.  Ovary: cysts or tumors.  Pelvic adhesions (scar tissue).  Endometriosis (uterus lining tissue growing in the pelvis and on the pelvic organs).  Pelvic congestion syndrome (female organs filling up with blood just before the menstrual period).  Pain with the menstrual period.  Pain with ovulation (producing an egg).  Pain with an IUD (intrauterine device, birth control) in the uterus.  Cancer of the female organs.  Functional pain (pain not caused by a disease, may improve without treatment).  Psychological pain.  Depression. DIAGNOSIS  Your doctor will decide the seriousness of your pain by doing an examination.  Blood tests.  X-rays.  Ultrasound.  CT scan (computed tomography, special type of X-ray).  MRI (magnetic resonance imaging).  Cultures, for infection.  Barium enema (dye inserted in the large intestine, to better view it with  X-rays).  Colonoscopy (looking in intestine with a lighted tube).  Laparoscopy (minor surgery, looking in abdomen with a lighted tube).  Major abdominal exploratory surgery (looking in abdomen with a large incision). TREATMENT  The treatment will depend on the cause of the pain.   Many cases can be observed and treated at home.  Over-the-counter medicines recommended by your caregiver.  Prescription medicine.  Antibiotics, for infection.  Birth control pills, for painful periods or for ovulation pain.  Hormone treatment, for endometriosis.  Nerve blocking injections.  Physical therapy.  Antidepressants.  Counseling with a psychologist or psychiatrist.  Minor or major surgery. HOME CARE INSTRUCTIONS   Do not take laxatives, unless directed by your caregiver.  Take over-the-counter pain medicine only if ordered by your caregiver. Do not take aspirin because it can cause an upset stomach or bleeding.  Try a clear liquid diet (broth or water) as ordered by your caregiver. Slowly move to a bland diet, as tolerated, if the pain is related to the stomach or intestine.  Have a thermometer and take your temperature several times a day, and record it.  Bed rest and sleep, if it helps the pain.  Avoid sexual intercourse, if it causes pain.  Avoid stressful situations.  Keep your follow-up appointments and tests, as your caregiver orders.  If the pain does not go away with medicine or surgery, you may try:  Acupuncture.  Relaxation exercises (yoga, meditation).  Group therapy.  Counseling. SEEK MEDICAL CARE IF:   You notice certain foods cause stomach pain.  Your home care treatment is not helping your pain.  You need stronger pain medicine.  You want your IUD removed.  You feel faint or  lightheaded.  You develop nausea and vomiting.  You develop a rash.  You are having side effects or an allergy to your medicine. SEEK IMMEDIATE MEDICAL CARE IF:   Your  pain does not go away or gets worse.  You have a fever.  Your pain is felt only in portions of the abdomen. The right side could possibly be appendicitis. The left lower portion of the abdomen could be colitis or diverticulitis.  You are passing blood in your stools (bright red or black tarry stools, with or without vomiting).  You have blood in your urine.  You develop chills, with or without a fever.  You pass out. MAKE SURE YOU:   Understand these instructions.  Will watch your condition.  Will get help right away if you are not doing well or get worse. Document Released: 09/19/2007 Document Revised: 04/08/2014 Document Reviewed: 10/09/2009 [facility] Patient Information 2015 [place]Care, MarylandLLC. This information is not intended to replace advice given to you by your health care provider. Make sure you discuss any questions you have with your health care provider.  Breast Tenderness Breast tenderness is a common problem for women of all ages. Breast tenderness may cause mild discomfort to severe pain. It has a variety of causes. Your health care provider will find out the likely cause of your breast tenderness by examining your breasts, asking you about symptoms, and ordering some tests. Breast tenderness usually does not mean you have breast cancer. HOME CARE INSTRUCTIONS  Breast tenderness often can be handled at home. You can try:  Getting fitted for a new bra that provides more support, especially during exercise.  Wearing a more supportive bra or sports bra while sleeping when your breasts are very tender.  If you have a breast injury, apply ice to the area:  Put ice in a plastic bag.  Place a towel between your skin and the bag.  Leave the ice on for 20 minutes, 2-3 times a day.  If your breasts are too full of milk as a result of breastfeeding, try:  Expressing milk either by hand or with a breast pump.  Applying a warm compress to the breasts for relief.  Taking  over-the-counter pain relievers, if approved by your health care provider.  Taking other medicines that your health care provider prescribes. These may include antibiotic medicines or birth control pills. Over the long term, your breast tenderness might be eased if you:  Cut down on caffeine.  Reduce the amount of fat in your diet. Keep a log of the days and times when your breasts are most tender. This will help you and your health care provider find the cause of the tenderness and how to relieve it. Also, learn how to do breast exams at home. This will help you notice if you have an unusual growth or lump that could cause tenderness. SEEK MEDICAL CARE IF:   Any part of your breast is hard, red, and hot to the touch. This could be a sign of infection.  Fluid is coming out of your nipples (and you are not breastfeeding). Especially watch for blood or pus.  You have a fever as well as breast tenderness.  You have a new or painful lump in your breast that remains after your menstrual period ends.  You have tried to take care of the pain at home, but it has not gone away.  Your breast pain is getting worse, or the pain is making it hard to do the  things you usually do during your day. Document Released: 11/04/2008 Document Revised: 07/25/2013 Document Reviewed: 06/21/2013 [facility] Patient Information 2015 [place]Care, MarylandLLC. This information is not intended to replace advice given to you by your health care provider. Make sure you discuss any questions you have with your health care provider.

## 2014-09-28 NOTE — ED Provider Notes (Signed)
CSN: [id number]     Arrival date & time 09/28/14  1447 History   First MD Initiated Contact with Patient 09/28/14 1508     Chief Complaint  Patient presents with  . Abdominal Pain     (Consider location/radiation/quality/duration/timing/severity/associated sxs/prior Treatment) HPI  Pt reports that [place] has been in [place] since May [place]. This is when her symptoms started happening. It started with her back and then she felt as though she couldn't sleep on her stomach. She feels things moving and was having sharp pains. She also reports her breast hurt and also feels as though she has gained weight. She is having episodes of moving in her stomach more frequently.  Sometimes can see her stomach move. Can not reproduce the symptoms on command. It goes from epigastric down to suprapubic and then left upper quadrant.  Denies vomiting, diarrhea, no dysuria or hematuria. LMP was 4 months ago. She saw a gynecologist 3 month ago. Pt says last meal was at 12 pm today.   Past Medical History  Diagnosis Date  . Medical history non-contributory    Past Surgical History  Procedure Laterality Date  . No past surgeries     History reviewed. No pertinent family history. History  Substance Use Topics  . Smoking status: Current Every Day Smoker -- 0.25 packs/day    Types: Cigarettes  . Smokeless tobacco: Not on file  . Alcohol Use: No   OB History   Grav Para Term Preterm Abortions TAB SAB Ect Mult Living   0         0     Review of Systems  10 Systems reviewed and are negative for acute change except as noted in the HPI.   Allergies  Review of patient's allergies indicates no known allergies.  Home Medications   Prior to Admission medications   Medication Sig Start Date End Date Taking? Authorizing Provider  folic acid (FOLVITE) 400 MCG tablet Take 400 mcg by mouth daily.   Yes Historical Provider, MD  Prenatal Vit-Fe Fumarate-FA (PRENATAL MULTIVITAMIN) TABS tablet Take 1 tablet by  mouth daily at 12 noon.   Yes Historical Provider, MD  famotidine (PEPCID) 20 MG tablet Take 1 tablet (20 mg total) by mouth 2 (two) times daily. 09/28/14   Meryn Sarracino Irine SealG Susie Ehresman, PA-C   BP 123/64  Pulse 73  Temp(Src) 98.6 F (37 C) (Oral)  Resp 16  SpO2 100% Physical Exam  Nursing note and vitals reviewed. Constitutional: She appears well-developed and well-nourished. No distress.  HENT:  Head: Normocephalic and atraumatic.  Eyes: Pupils are equal, round, and reactive to light.  Neck: Normal range of motion. Neck supple.  Cardiovascular: Normal rate and regular rhythm.   Pulmonary/Chest: Effort normal.  Abdominal: Soft. Bowel sounds are normal. She exhibits no distension. There is no tenderness. There is no rigidity, no rebound and no guarding.  Neurological: She is alert.  Skin: Skin is warm and dry.    ED Course  Procedures (including critical care time) Labs Review Labs Reviewed  CBC WITH DIFFERENTIAL - Abnormal; Notable for the following:    Hemoglobin 11.8 (*)    HCT 35.1 (*)    Neutrophils Relative % 41 (*)    All other components within normal limits  COMPREHENSIVE METABOLIC PANEL - Abnormal; Notable for the following:    Sodium 136 (*)    Glucose, Bld 65 (*)    All other components within normal limits  URINALYSIS, ROUTINE W REFLEX MICROSCOPIC - Abnormal; Notable for  the following:    Specific Gravity, Urine 1.004 (*)    All other components within normal limits  POC URINE PREG, ED  CBG MONITORING, ED  CBG MONITORING, ED    Imaging Review Dg Abd 2 Views  09/28/2014   CLINICAL DATA:  Abdominal pain x months, urinary frequency, increased appetite  EXAM: ABDOMEN - 2 VIEW  COMPARISON:  None.  FINDINGS: Nonobstructive bowel gas pattern. Mild to moderate colonic stool burden.  No evidence of free air under the diaphragm on the upright view.  Visualized osseous structures are within normal limits.  IMPRESSION: No evidence of small bowel obstruction or free air.    Electronically Signed   By: Charline BillsSriyesh  Krishnan M.D.   On: 09/28/2014 17:59     EKG Interpretation None      MDM   Final diagnoses:  Abdominal pain  Breast tenderness in female    Pt has a normal exam and normal labs. [facility]. Pt symptoms are vague and unclear. Possibly thyroid problem or hormonal problem.  Will try antacid to see if it gives patient pain relief. Dr. Romeo AppleHarrison has seen patient as well and is agreeable to my work-up, evaluation and plan.  18 y.o.Sonya Lindsey's evaluation in the Emergency Department is complete. It has been determined that no acute conditions requiring further emergency intervention are present at this time. The patient/guardian have been advised of the diagnosis and plan. We have discussed signs and symptoms that warrant return to the ED, such as changes or worsening in symptoms.  Vital signs are stable at discharge. Filed Vitals:   09/28/14 1530  BP: 123/64  Pulse: 73  Temp:   Resp:     Patient/guardian has voiced understanding and agreed to follow-up with the PCP or specialist.     Dorthula Matasiffany G Cayce Paschal, PA-C 09/28/14 2334

## 2014-09-28 NOTE — ED Notes (Signed)
Pt given cup of water.  Tolerating PO fluids.

## 2014-09-28 NOTE — ED Notes (Signed)
Pt reporting white vaginal discharge during nursing assessment.

## 2014-09-28 NOTE — ED Notes (Addendum)
She states shes been having abd and breast pain x months. [facility] and states "they found nothing wrong and told me i was not pregnant." she states "it literally feels like there is something in my stomach." denies n/v/bowel changes. Reports urinary frequency. States pain is only present when she moves. shes also been sleeping a lot and increased appetite.

## 2014-09-29 NOTE — ED Provider Notes (Signed)
Medical screening examination/treatment/procedure(s) were conducted as a shared visit with non-physician practitioner(s) and myself.  I personally evaluated the patient during the encounter.   EKG Interpretation None      I interviewed and examined the patient. Lungs are CTAB. Cardiac exam wnl. Abdomen soft. I interpreted/reviewed the labs and/or imaging which were non-contributory.  Will rec pt f/u and estab w/ a pcp.   Purvis SheffieldForrest Ermagene Saidi, MD 09/29/14 1101

## 2014-09-30 LAB — CBG MONITORING, ED: Glucose-Capillary: 105 mg/dL — ABNORMAL HIGH (ref 70–99)

## 2014-11-05 ENCOUNTER — Emergency Department (HOSPITAL_COMMUNITY)
Admission: EM | Admit: 2014-11-05 | Discharge: 2014-11-05 | Disposition: A | Discharge status: Home / Self Care | Payer: Self-pay | Attending: Emergency Medicine | Admitting: Emergency Medicine

## 2014-11-05 ENCOUNTER — Encounter (HOSPITAL_COMMUNITY): Payer: Self-pay | Admitting: Emergency Medicine

## 2014-11-05 DIAGNOSIS — G8929 Other chronic pain: Secondary | ICD-10-CM | POA: Insufficient documentation

## 2014-11-05 DIAGNOSIS — R1031 Right lower quadrant pain: Secondary | ICD-10-CM

## 2014-11-05 DIAGNOSIS — R1032 Left lower quadrant pain: Secondary | ICD-10-CM

## 2014-11-05 DIAGNOSIS — Z720 Tobacco use: Secondary | ICD-10-CM | POA: Insufficient documentation

## 2014-11-05 DIAGNOSIS — N939 Abnormal uterine and vaginal bleeding, unspecified: Secondary | ICD-10-CM | POA: Insufficient documentation

## 2014-11-05 DIAGNOSIS — Z3202 Encounter for pregnancy test, result negative: Secondary | ICD-10-CM | POA: Insufficient documentation

## 2014-11-05 DIAGNOSIS — Z79899 Other long term (current) drug therapy: Secondary | ICD-10-CM | POA: Insufficient documentation

## 2014-11-05 DIAGNOSIS — R1030 Lower abdominal pain, unspecified: Secondary | ICD-10-CM | POA: Insufficient documentation

## 2014-11-05 LAB — URINALYSIS, ROUTINE W REFLEX MICROSCOPIC
Bilirubin Urine: NEGATIVE
Glucose, UA: NEGATIVE mg/dL
KETONES UR: NEGATIVE mg/dL
LEUKOCYTES UA: NEGATIVE
Nitrite: NEGATIVE
PROTEIN: NEGATIVE mg/dL
Specific Gravity, Urine: 1.022 (ref 1.005–1.030)
UROBILINOGEN UA: 0.2 mg/dL (ref 0.0–1.0)
pH: 7 (ref 5.0–8.0)

## 2014-11-05 LAB — URINE MICROSCOPIC-ADD ON

## 2014-11-05 LAB — WET PREP, GENITAL
Trich, Wet Prep: NONE SEEN
Yeast Wet Prep HPF POC: NONE SEEN

## 2014-11-05 LAB — CBC WITH DIFFERENTIAL/PLATELET
BASOS ABS: 0 10*3/uL (ref 0.0–0.1)
BASOS PCT: 0 % (ref 0–1)
EOS ABS: 0.1 10*3/uL (ref 0.0–0.7)
Eosinophils Relative: 3 % (ref 0–5)
HCT: 34.4 % — ABNORMAL LOW (ref 36.0–46.0)
Hemoglobin: 11.4 g/dL — ABNORMAL LOW (ref 12.0–15.0)
Lymphocytes Relative: 50 % — ABNORMAL HIGH (ref 12–46)
Lymphs Abs: 2.4 10*3/uL (ref 0.7–4.0)
MCH: 27.7 pg (ref 26.0–34.0)
MCHC: 33.1 g/dL (ref 30.0–36.0)
MCV: 83.5 fL (ref 78.0–100.0)
Monocytes Absolute: 0.3 10*3/uL (ref 0.1–1.0)
Monocytes Relative: 6 % (ref 3–12)
NEUTROS PCT: 41 % — AB (ref 43–77)
Neutro Abs: 1.9 10*3/uL (ref 1.7–7.7)
Platelets: 332 10*3/uL (ref 150–400)
RBC: 4.12 MIL/uL (ref 3.87–5.11)
RDW: 12.6 % (ref 11.5–15.5)
WBC: 4.7 10*3/uL (ref 4.0–10.5)

## 2014-11-05 LAB — COMPREHENSIVE METABOLIC PANEL
ALT: 24 U/L (ref 0–35)
AST: 19 U/L (ref 0–37)
Albumin: 3.6 g/dL (ref 3.5–5.2)
Alkaline Phosphatase: 69 U/L (ref 39–117)
Anion gap: 12 (ref 5–15)
BILIRUBIN TOTAL: 0.3 mg/dL (ref 0.3–1.2)
BUN: 12 mg/dL (ref 6–23)
CALCIUM: 9 mg/dL (ref 8.4–10.5)
CHLORIDE: 102 meq/L (ref 96–112)
CO2: 26 mEq/L (ref 19–32)
Creatinine, Ser: 0.58 mg/dL (ref 0.50–1.10)
GLUCOSE: 86 mg/dL (ref 70–99)
Potassium: 4 mEq/L (ref 3.7–5.3)
Sodium: 140 mEq/L (ref 137–147)
Total Protein: 7.3 g/dL (ref 6.0–8.3)

## 2014-11-05 LAB — POC URINE PREG, ED: Preg Test, Ur: NEGATIVE

## 2014-11-05 MED ORDER — HYDROCODONE-ACETAMINOPHEN 5-325 MG PO TABS
1.0000 | ORAL_TABLET | Freq: Once | ORAL | Status: AC
  Administered 2014-11-05: 0.5 via ORAL
  Filled 2014-11-05: qty 1

## 2014-11-05 MED ORDER — NAPROXEN 500 MG PO TABS: 500.0000 mg | ORAL_TABLET | Freq: Two times a day (BID) | ORAL | Status: AC | PRN

## 2014-11-05 MED ORDER — HYDROCODONE-ACETAMINOPHEN 5-325 MG PO TABS
1.0000 | ORAL_TABLET | Freq: Once | ORAL | Status: DC
  Filled 2014-11-05: qty 1

## 2014-11-05 NOTE — ED Notes (Signed)
Pt remains monitored by blood pressure and pulse ox.  

## 2014-11-05 NOTE — ED Provider Notes (Signed)
CSN: [id number]     Arrival date & time 11/05/14  1356 History   First MD Initiated Contact with Patient 11/05/14 1815     Chief Complaint  Patient presents with  . Abdominal Pain     (Consider location/radiation/quality/duration/timing/severity/associated sxs/prior Treatment) HPI   18 year old female who presents complaining of abdominal pain.  Patient reports intermittent low abdominal pain which has been ongoing for the past 7 months. She described pain as a sharp sensation radiating across her low abdomen, lasting for seconds, and resolved without any specific treatment. Sometimes movement may worsen the pain. Patient states she has noticed increase in abdominal girth for the past several months. She also report mild bilateral breast pain that is waxing and waning. She endorses occasional nausea without vomiting or diarrhea, although did report some loose stools this morning. She reported having vaginal bleeding for the past 2 days that is heavier than usual. Vaginal bleeding is with clots and states she goes through 5 pads per day. She report no specific treatment tried. States she has not had a sexual activities for the past several months. No complaints of fever, chills, productive cough, shortness of breath, back pain, dysuria, hematuria, vaginal discharge, or rash. Has family history of diabetes and does report mild polyuria and polydipsia. Patient states she has been seen for the same complaint several times in the past both through the ER and [facility] but no definitive diagnosis was given. She does not have a primary care provider.  Past Medical History  Diagnosis Date  . Medical history non-contributory    Past Surgical History  Procedure Laterality Date  . No past surgeries     History reviewed. No pertinent family history. History  Substance Use Topics  . Smoking status: Current Every Day Smoker -- 0.25 packs/day    Types: Cigarettes  . Smokeless tobacco: Not on  file  . Alcohol Use: No   OB History    Gravida Para Term Preterm AB TAB SAB Ectopic Multiple Living   0         0     Review of Systems  All other systems reviewed and are negative.     Allergies  Review of patient's allergies indicates no known allergies.  Home Medications   Prior to Admission medications   Medication Sig Start Date End Date Taking? Authorizing Provider  famotidine (PEPCID) 20 MG tablet Take 1 tablet (20 mg total) by mouth 2 (two) times daily. 09/28/14   Dorthula Matasiffany G Greene, PA-C  folic acid (FOLVITE) 400 MCG tablet Take 400 mcg by mouth daily.    Historical Provider, MD  Prenatal Vit-Fe Fumarate-FA (PRENATAL MULTIVITAMIN) TABS tablet Take 1 tablet by mouth daily at 12 noon.    Historical Provider, MD   BP 112/57 mmHg  Pulse 78  Temp(Src) 98.3 F (36.8 C) (Oral)  Resp 18  SpO2 100%  LMP 11/05/2014 Physical Exam  Constitutional: She appears well-developed and well-nourished. No distress.  HENT:  Head: Atraumatic.  Eyes: Conjunctivae are normal.  Neck: Neck supple.  Cardiovascular: Normal rate and regular rhythm.   Pulmonary/Chest: Effort normal and breath sounds normal.  Abdominal: Soft. Bowel sounds are normal. She exhibits no distension. There is tenderness (mild suprapubic tenderness and left lower quadrant abdominal tenderness on palpation without guarding or rebound tenderness.).  No CVA tenderness  Genitourinary:  Chaperone present:  No inguinal lymphadenopathy: Normal external genitalia, no significant discomfort with speculum insertion. Blood noted in vaginal vault, cervical os is visible and  closed. No significant discharge noted. On bimanual examination, no adnexal tenderness and no cervical motion tenderness  Neurological: She is alert.  Skin: No rash noted.  Psychiatric: She has a normal mood and affect.  Nursing note and vitals reviewed.   ED Course  Procedures (including critical care time)  6:35 PM Patient with episodic low  abnormal pain for the past 7 months. She has a nonsurgical abdomen on exam. She is afebrile with stable normal vital sign. Plan to perform pelvic examination and perform screening labs.  8:52 PM Labs are reassuring, patient resting comfortably in bed. No evidence to suggest PID on exam. No acute emergent medical condition at this time. Pregnancy test is negative. Plan to have patient follow-up with primary care Dr. for outpatient follow-up. Return precautions discussed.  Labs Review Labs Reviewed  WET PREP, GENITAL - Abnormal; Notable for the following:    Clue Cells Wet Prep HPF POC FEW (*)    WBC, Wet Prep HPF POC FEW (*)    All other components within normal limits  CBC WITH DIFFERENTIAL - Abnormal; Notable for the following:    Hemoglobin 11.4 (*)    HCT 34.4 (*)    Neutrophils Relative % 41 (*)    Lymphocytes Relative 50 (*)    All other components within normal limits  GC/CHLAMYDIA PROBE AMP  COMPREHENSIVE METABOLIC PANEL  URINALYSIS, ROUTINE W REFLEX MICROSCOPIC  POC URINE PREG, ED    Imaging Review No results found.   EKG Interpretation None      MDM   Final diagnoses:  Chronic bilateral lower abdominal pain    BP 121/71 mmHg  Pulse 63  Temp(Src) 98.3 F (36.8 C) (Oral)  Resp 16  SpO2 100%  LMP 11/05/2014     Fayrene HelperBowie Rigdon Macomber, PA-C 11/05/14 2053  Gerhard Munchobert Lockwood, MD 11/06/14 [phone]

## 2014-11-05 NOTE — Discharge Instructions (Signed)
Abdominal Pain, Women °Abdominal (stomach, pelvic, or belly) pain can be caused by many things. It is important to tell your doctor: °· The location of the pain. °· Does it come and go or is it present all the time? °· Are there things that start the pain (eating certain foods, exercise)? °· Are there other symptoms associated with the pain (fever, nausea, vomiting, diarrhea)? °All of this is helpful to know when trying to find the cause of the pain. °CAUSES  °· Stomach: virus or bacteria infection, or ulcer. °· Intestine: appendicitis (inflamed appendix), regional ileitis (Crohn's disease), ulcerative colitis (inflamed colon), irritable bowel syndrome, diverticulitis (inflamed diverticulum of the colon), or cancer of the stomach or intestine. °· Gallbladder disease or stones in the gallbladder. °· Kidney disease, kidney stones, or infection. °· Pancreas infection or cancer. °· Fibromyalgia (pain disorder). °· Diseases of the female organs: °¨ Uterus: fibroid (non-cancerous) tumors or infection. °¨ Fallopian tubes: infection or tubal pregnancy. °¨ Ovary: cysts or tumors. °¨ Pelvic adhesions (scar tissue). °¨ Endometriosis (uterus lining tissue growing in the pelvis and on the pelvic organs). °¨ Pelvic congestion syndrome (female organs filling up with blood just before the menstrual period). °¨ Pain with the menstrual period. °¨ Pain with ovulation (producing an egg). °¨ Pain with an IUD (intrauterine device, birth control) in the uterus. °¨ Cancer of the female organs. °· Functional pain (pain not caused by a disease, may improve without treatment). °· Psychological pain. °· Depression. °DIAGNOSIS  °Your doctor will decide the seriousness of your pain by doing an examination. °· Blood tests. °· X-rays. °· Ultrasound. °· CT scan (computed tomography, special type of X-ray). °· MRI (magnetic resonance imaging). °· Cultures, for infection. °· Barium enema (dye inserted in the large intestine, to better view it with  X-rays). °· Colonoscopy (looking in intestine with a lighted tube). °· Laparoscopy (minor surgery, looking in abdomen with a lighted tube). °· Major abdominal exploratory surgery (looking in abdomen with a large incision). °TREATMENT  °The treatment will depend on the cause of the pain.  °· Many cases can be observed and treated at home. °· Over-the-counter medicines recommended by your caregiver. °· Prescription medicine. °· Antibiotics, for infection. °· Birth control pills, for painful periods or for ovulation pain. °· Hormone treatment, for endometriosis. °· Nerve blocking injections. °· Physical therapy. °· Antidepressants. °· Counseling with a psychologist or psychiatrist. °· Minor or major surgery. °HOME CARE INSTRUCTIONS  °· Do not take laxatives, unless directed by your caregiver. °· Take over-the-counter pain medicine only if ordered by your caregiver. Do not take aspirin because it can cause an upset stomach or bleeding. °· Try a clear liquid diet (broth or water) as ordered by your caregiver. Slowly move to a bland diet, as tolerated, if the pain is related to the stomach or intestine. °· Have a thermometer and take your temperature several times a day, and record it. °· Bed rest and sleep, if it helps the pain. °· Avoid sexual intercourse, if it causes pain. °· Avoid stressful situations. °· Keep your follow-up appointments and tests, as your caregiver orders. °· If the pain does not go away with medicine or surgery, you may try: °¨ Acupuncture. °¨ Relaxation exercises (yoga, meditation). °¨ Group therapy. °¨ Counseling. °SEEK MEDICAL CARE IF:  °· You notice certain foods cause stomach pain. °· Your home care treatment is not helping your pain. °· You need stronger pain medicine. °· You want your IUD removed. °· You feel faint or   lightheaded. °· You develop nausea and vomiting. °· You develop a rash. °· You are having side effects or an allergy to your medicine. °SEEK IMMEDIATE MEDICAL CARE IF:  °· Your  pain does not go away or gets worse. °· You have a fever. °· Your pain is felt only in portions of the abdomen. The right side could possibly be appendicitis. The left lower portion of the abdomen could be colitis or diverticulitis. °· You are passing blood in your stools (bright red or black tarry stools, with or without vomiting). °· You have blood in your urine. °· You develop chills, with or without a fever. °· You pass out. °MAKE SURE YOU:  °· Understand these instructions. °· Will watch your condition. °· Will get help right away if you are not doing well or get worse. °Document Released: 09/19/2007 Document Revised: 04/08/2014 Document Reviewed: 10/09/2009 °ExitCare® Patient Information ©2015 [employer]. This information is not intended to replace advice given to you by your health care provider. Make sure you discuss any questions you have with your health care provider. ° °Emergency Department Resource Guide °1) Find a Doctor and Pay Out of Pocket °Although you won't have to find out who is covered by your insurance plan, it is a good idea to ask around and get recommendations. You will then need to call the office and see if the doctor you have chosen will accept you as a new patient and what types of options they offer for patients who are self-pay. Some doctors offer discounts or will set up payment plans for their patients who do not have insurance, but you will need to ask so you aren't surprised when you get to your appointment. ° °2) Contact Your Local Health Department °Not all health departments have doctors that can see patients for sick visits, but many do, so it is worth a call to see if yours does. If you don't know where your local health department is, you can check in your phone book. The CDC also has a tool to help you locate your state's health department, and many state websites also have listings of all of their local health departments. ° °3) Find a Walk-in Clinic °If your illness is  not likely to be very severe or complicated, you may want to try a walk in clinic. These are popping up all over the country in pharmacies, drugstores, and shopping centers. They're usually staffed by nurse practitioners or physician assistants that have been trained to treat common illnesses and complaints. They're usually fairly quick and inexpensive. However, if you have serious medical issues or chronic medical problems, these are probably not your best option. ° °No Primary Care Doctor: °- Call [facility] at  832-8000 - they can help you locate a primary care doctor that  accepts your insurance, provides certain services, etc. °- Physician Referral Service- 1-800-533-3463 ° °Chronic Pain Problems: °Organization         Address  Phone   Notes  °[facility]  (336) 297-2271 Patients need to be referred by their primary care doctor.  ° °Medication Assistance: °Organization         Address  Phone   Notes  °[place] [facility] [address] °[place], [place] [address] (336) 641-8030 --Must be a resident of [place] °-- Must have NO insurance coverage whatsoever (no Medicaid/ Medicare, etc.) °-- The pt. MUST have a primary care doctor that directs their care regularly   and follows them in the community °  °[facility]  (866) 331-1348   °[employer]  (888) 892-1162   ° °Agencies that provide inexpensive medical care: °Organization         Address  Phone   Notes  °[facility]  (336) 832-8035   °[facility]    (336) 832-7272   °[facility] [address] °[place], [place] [address] (336) 832-4777   °[facility] [address], °[place] (336) 271-4999   °[facility]    (336) 373-0678   °[facility]    (336) 272-1050   °[facility] ° [address], [place] Phone:  (336) 832-4444, Fax:  (336) 832-4440 Hours of Operation:  9 am - 6  pm, M-F.  Also accepts Medicaid/Medicare and self-pay.  °[facility] ° [address], [place] Phone: (336) [phone], Fax: (336) [phone]. Hours of Operation:  8:30 am - 5:30 pm, M-F.  Also accepts Medicaid and self-pay.  °[facility] [place] [address], [place] Phone: (336) 878-6027   °[facility] [address], [place], [place] (336)723-1848, Ext. 123 Mondays & Thursdays: 7-9 AM.  First 15 patients are seen on a first come, first serve basis. °  ° °Medicaid-accepting [place] Providers: ° °Organization         Address  Phone   Notes  °[facility] [address], [place] (336) 641-2100 Also accepts self-pay patients.  °[facility] [address], [place] ° (336) 856-9996   °[facility] [address], [place] (336) 288-8857   °[facility] [address] [place] [address], [place] (336) 299-7000   °Veita Bland [address], [place]  ° (336) 373-1557 Only accepts [place] Access Medicaid patients after they have their name applied to their card.  ° °Self-Pay (no insurance) in [place]: ° °Organization         Address  Phone   Notes  °Sickle Cell Patients, [facility] [address], [place] (336) 832-1970   °[facility] [address], [place] (336) 832-4400   °[facility] ° [address], [place] (336) 992-4800   °[facility]/Dr. Osei-Bonsu ° [address] [place] [address], [place] or [address], [place] (336) [phone] Phone number for both [place] and [place] locations is the same.  °[facility] [address], [place] (336) 299-0000   °[facility] [address] [place] [address], [place] or [address] (336) 852-7530 °(336) 878-2260   °[facility] [address], [place] (336) 350-1642, phone; (336) 294-5005, fax Sees patients 1st and 3rd Saturday of every month.  Must not qualify for public or private insurance (i.e. Medicaid, Medicare, [place] Health Choice, Veterans' Benefits) • Household income should be no more than 200% of the poverty level •The clinic cannot treat you if you are pregnant or think you are pregnant • Sexually transmitted diseases are not treated at the clinic.  ° ° °Dental Care: °Organization         Address  Phone  Notes  °[facility] [address], [place] (336) 641-6152 Accepts children up to age 21 who   are enrolled in Medicaid or [place] Health Choice; pregnant women with a Medicaid card; and children who have applied for Medicaid or [place] Health Choice, but were declined, whose parents can pay a reduced fee at time of service.  °[facility] [place]  [address], [place] (336) 641-7733 Accepts children up to age 21 who are enrolled in Medicaid or [place] Health Choice; pregnant women with a Medicaid card; and children who have applied for Medicaid or [place] Health Choice, but were declined, whose parents can pay a reduced fee at time of service.  °[facility] ° [address], [place] (336) 641-4533 Patients are seen by appointment only. Walk-ins are not accepted. [facility] will see patients 18 years of age and older. °Monday - Tuesday (8am-5pm) °Most Wednesdays (8:30-5pm) °$30 per visit, cash only  °[facility] ° [address], [place] (336) 641-4533 Patients are seen by appointment only. Walk-ins are not accepted. [facility] will see patients 18 years of age and older. °One Wednesday Evening (Monthly: Volunteer Based).  $30 per visit, cash only  °[facility]  (919) [phone] for adults; Children under age 4, call Graduate Pediatric Dentistry at ([phone]  [phone]. Children aged 4-14, please call ([phone]) [phone] to request a pediatric application. ° Dental services are provided in all areas of dental care including fillings, crowns and bridges, complete and partial dentures, implants, gum treatment, root canals, and extractions. Preventive care is also provided. Treatment is provided to both adults and children. °Patients are selected via a lottery and there is often a waiting list. °  °[facility] [address], °[place] ° (336) 763-8833 [email or web address] °  °[facility] [address], [place], [place] (336)723-1848, Ext. 123 Second and Fourth Thursday of each month, opens at 6:30 AM; Clinic ends at 9 AM.  Patients are seen on a first-come first-served basis, and a limited number are seen during each clinic.  ° °[facility] ° [address], [place], [place] (336) 723-7904   Eligibility Requirements °You must have lived in [place], [place], or [place] counties for at least the last three months. °  You cannot be eligible for state or federal sponsored healthcare insurance, including Veterans Administration, Medicaid, or Medicare. °  You generally cannot be eligible for healthcare insurance through your employer.  °  How to apply: °Eligibility screenings are held every Tuesday and Wednesday afternoon from 1:00 pm until 4:00 pm. You do not need an appointment for the interview!  °[facility] [address], [place], [place] 336-631-2330   °[facility]  336-342-8273   °[facility]  336-703-3100   °[facility]  336-570-6415   ° °Behavioral Health Resources in the Community: °Intensive Outpatient Programs °Organization         Address  Phone  Notes  °[facility] [address], [place], [place] 336-878-6098   °[facility] [address], [place], [place] 336-832-9800   °[facility]  [address], [place], [place] ° 336-882-2125   °[facility] [address],  °[place], [place] 1-800-853-5163 or 336-641-4981   °Substance Abuse Resources °Organization         Address  Phone  Notes  °[facility]  336-882-2125   °[facility]  336-784-9470   °  [facility]  336-285-9073   °[facility]  336-845-3988   °Residential & Outpatient Substance Abuse Program  1-800-659-3381   °Psychological Services °Organization         Address  Phone  Notes  °[facility]  336- 832-9600   °[facility]  336- 378-7881   °[facility] [address], [place] 1-800-853-5163 or 336-641-4981   ° °Mobile Crisis Teams °Organization         Address  Phone  Notes  °[facility], Mobile Crisis Care Unit  1-877-626-1772   °Assertive °[facility] ° [address] [place], [place] 336-834-9664   °Sharon DeEsch [address] °[place] 336-554-5454   ° °Self-Help/Support Groups °Organization         Address  Phone             Notes  °[facility] - variety of support groups  336- 373-1402 Call for more information  °Narcotics Anonymous (NA), [facility] [address], °[place]  2 meetings at this location  ° °Residential Treatment Programs °Organization         Address  Phone  Notes  °[facility] [address],    °[place]  1-866-801-8205   °[facility] ° [address], [place], [place] 704-293-8524   °[facility] [address], [place] 336-845-3988 Admissions: 8am-3pm M-F  °[facility] [address]    °[place], [place] 336-841-1104   °[facility] [address], [place], [place] 336-379-7146   °[facility] [address]  °[place], [place] 336-285-9073   °[facility] Intensive Outpatient [address], [place], [place]  336-852-3033   °[facility] [address]  °[place], [place] 1-877-615-2722 or 336-784-9470   °[facility] [address] [place], [place] 336-227-7417 Accepts Medicaid  °[facility] [address]  °[place] 1-800-659-3381 Substance Abuse/Addiction Treatment  ° °[place] Behavioral Health Resources °Organization         Address  Phone  Notes  °[facility]  (888) 581-9988   °Julie Brannon, PhD [address] [place], [place]   (336) 349-5553 or (336) 951-0000   °[facility]   [address] °[place], [place] (336) 349-4454   °[facility] [address], [place], [place] (336) 342-8316 Insurance/Medicaid/sponsorship through [facility]  °[facility] [address]                                    [place], [place] (336) 342-8316 Therapy/tele-psych/case  °[facility] [address]  ° [place], [place] (336) 349-2233    °Dr. Arfeen  (336) 349-4544   °[facility] [place]  [employer] [place] [facility] 1) [address], [place] °2) [address], [place] °3)  [address], [place] (336) 349-3220 °(336) 342-7768 ° °(336) 342-8140   °[place] [facility] (336) 342-1394 or (336) 342-3537 (After Hours)    ° ° °

## 2014-11-05 NOTE — ED Notes (Signed)
Pelvic cart at bedside. 

## 2014-11-05 NOTE — ED Notes (Addendum)
Pt states here 2 times for this; it is now worsening. Back pain every night that keep her from sleeping. Shortness of breath and breast pain also reported. [facility].

## 2014-11-06 LAB — GC/CHLAMYDIA PROBE AMP
CT Probe RNA: NEGATIVE
GC PROBE AMP APTIMA: NEGATIVE

## 2015-02-13 ENCOUNTER — Emergency Department (INDEPENDENT_AMBULATORY_CARE_PROVIDER_SITE_OTHER)
Admission: EM | Admit: 2015-02-13 | Discharge: 2015-02-13 | Disposition: A | Discharge status: Home / Self Care | Payer: Medicaid Other | Source: Home / Self Care | Attending: Family Medicine | Admitting: Family Medicine

## 2015-02-13 ENCOUNTER — Encounter (HOSPITAL_COMMUNITY): Payer: Self-pay | Admitting: Emergency Medicine

## 2015-02-13 DIAGNOSIS — R109 Unspecified abdominal pain: Secondary | ICD-10-CM

## 2015-02-13 DIAGNOSIS — N644 Mastodynia: Secondary | ICD-10-CM | POA: Diagnosis not present

## 2015-02-13 DIAGNOSIS — G8929 Other chronic pain: Secondary | ICD-10-CM | POA: Diagnosis not present

## 2015-02-13 DIAGNOSIS — M549 Dorsalgia, unspecified: Secondary | ICD-10-CM

## 2015-02-13 HISTORY — DX: Unspecified asthma, uncomplicated: J45.909

## 2015-02-13 HISTORY — DX: Anxiety disorder, unspecified: F41.9

## 2015-02-13 LAB — POCT PREGNANCY, URINE: Preg Test, Ur: NEGATIVE

## 2015-02-13 NOTE — ED Notes (Addendum)
Multiple complaints:  Back pain, breast soreness, abdominal pain, intermittent pain, but occurs every day.  Patient reports breast soreness  Is not related to menstrual cycle Patient reports visits to ed, but told nothing is wrong.

## 2015-02-13 NOTE — ED Notes (Signed)
Not in treatment room 

## 2015-02-13 NOTE — Discharge Instructions (Signed)
[facility]: [address] °[place] [address]  °[phone] ° °[facility]: [address] °[place] [address]   °[phone] ° °[facility]: [address] °[place] [address]  °[phone] ° °[facility] : [address] [place] [address] °[phone] ° °[facility]: [address] °[place] [address] °[phone] ° °[facility]: [address] °[place] [address] °[phone] ° °Dr. Mahima Pandey [address] [facility] [place] [address]  °[phone] ° °Dr. George Osei-Bonsu, [facility]. [address] [place] [address] [place], [place] [address]  °[phone] ° ° ° °Go to www.goodrx.com to look up your medications. This will give you a list of where you can find your prescriptions at the most affordable prices.  ° °If you have no primary doctor, here are some resources that may be helpful: ° °Medicaid-accepting [place] Providers: °- [facility]- [address]  °[phone];  ° °- [facility]- [address], [address] °[phone] ° °- [facility]- [address], [address] °[phone] °- [facility]- [address] [place] [address] ° °[phone] ° °- [facility]- [address] °[phone] ° Only accepts  Access Medicaid patients       after they have her name applied to their card ° °-Dr. George Osei-Bonsu, [facility]. [address] [place] [address]    [place], [place] [address]  °[phone] ° °Self Pay (no insurance) in [place]: °- Sickle Cell Patients: Dr Eric Dean, [facility] [address] [phone] ° °- [facility]- [phone] ° °- Physician Referral Service- [phone] ° °-  [facility]- [address], [place], [phone];  °Monday to Friday, 9 a.m. - 7 p.m.; Saturday 9 a.m. to 1 p.m. ° °- [facility] [place]- [address] [place], [place] [phone] ° °- [facility]- [address] [place] [address] °     [phone] °- [facility]- [address] [phone] ° °-[facility], [address] [place]; [phone]; or [address] [place] [address], [place]; [phone].  ° °[facility] [place], [address] [place]; [phone]; Monday to Wednesday, 8:30 a.m. - 5 p.m.; Thursday, 8:30 a.m. - 8 p.m. ° °[facility], [address], [place]; [phone]; Monday to Friday, 8 a.m. - 4:30 p.m.  ° °[facility], [address] [place], [phone]; first and third Saturday of the month, 9:30 a.m. - 12:30 p.m. ° °[facility], [address] , [phone]; second Saturday of the month, 9 a.m. -noon. ° °[facility] for children. For information, call [phone]; [phone]; or [phone]. ° °Other agencies that provide inexpensive medical care: ° °   [facility]  [phone] °   [facility]  [phone] °   [facility]  [phone]  [place] [address] °   [facility]  [phone] °   [facility]  [phone], [phone]; or [phone]. ° °Chronic Pain Problems °Contact [facility]  [phone] °Patients need to be referred by their primary care doctor. ° °[place] Resources ° °[facility] [place]       [employer]                          [facility] °[address] [place]                       [address]      [address]  ° °[phone] (After Hours) ° °General Information: °Finding a doctor when you do not have health insurance can be tricky. Although you are not limited by an insurance plan, you are of course limited by her finances  and how much but he can pay out of pocket. ° °What are your options if you don't have health insurance? °  °1) Find a Doctor and Pay Out of Pocket °Although you won't have to find out who is covered by your insurance plan, it is a good idea to ask around and get recommendations. You will then need to call the office and see if the doctor you have chosen will accept you as a new patient and what types of options they offer for patients who are self-pay. Some doctors offer discounts or will set up payment plans for their patients who do not have insurance, but you will need to ask so you aren't surprised when you get to your appointment. ° °2) Contact Your Local Health Department °Not all health departments have doctors that can see patients for sick visits, but many do, so it is worth a call to see if yours does. If you don't know where your local health department is, you can check in your phone book. The CDC also has a tool to help you locate your state's health department, and many state websites also have listings of all of their local health departments. ° °3) Find a Walk-in Clinic °If your illness is not likely to be very severe or complicated, you may want to try a walk in clinic. These are popping up all over the country in pharmacies, drugstores, and shopping centers. They're usually staffed by nurse practitioners or physician assistants that have been trained to treat common illnesses and complaints. They're usually fairly quick and inexpensive. However, if you have serious medical issues or chronic medical problems, these are probably not your best option ° ° °Chronic Pain °Chronic pain can be defined as pain that is off and on and lasts for 3-6 months or longer. Many things cause chronic pain, which can make it difficult to make a diagnosis. There are many treatment options available for chronic pain. However, finding a treatment that works well for you may require trying various approaches until the  right one is found. Many people benefit from a combination of two or more types of treatment to control their pain. °SYMPTOMS  °Chronic pain can occur anywhere in the body and can range from mild to very severe. Some types of chronic pain include: °· Headache. °· Low back pain. °· Cancer pain. °· Arthritis pain. °· Neurogenic pain. This is pain resulting from damage to nerves. ° People with chronic pain may also have other symptoms such as: °· Depression. °· Anger. °· Insomnia. °· Anxiety. °DIAGNOSIS  °Your health care provider will help diagnose your condition over time. In many cases, the initial focus will be on excluding possible conditions that could be causing the pain. Depending on your symptoms, your health care provider may order tests to diagnose your condition. Some of these tests   may include:  °· Blood tests.   °· CT scan.   °· MRI.   °· X-rays.   °· Ultrasounds.   °· Nerve conduction studies.   °You may need to see a specialist.  °TREATMENT  °Finding treatment that works well may take time. You may be referred to a pain specialist. He or she may prescribe medicine or therapies, such as:  °· Mindful meditation or yoga. °· Shots (injections) of numbing or pain-relieving medicines into the spine or area of pain. °· Local electrical stimulation. °· Acupuncture.   °· Massage therapy.   °· Aroma, color, light, or sound therapy.   °· Biofeedback.   °· Working with a physical therapist to keep from getting stiff.   °· Regular, gentle exercise.   °· Cognitive or behavioral therapy.   °· Group support.   °Sometimes, surgery may be recommended.  °HOME CARE INSTRUCTIONS  °· Take all medicines as directed by your health care provider.   °· Lessen stress in your life by relaxing and doing things such as listening to calming music.   °· Exercise or be active as directed by your health care provider.   °· Eat a healthy diet and include things such as vegetables, fruits, fish, and lean meats in your diet.   °· Keep all  follow-up appointments with your health care provider.   °· Attend a support group with others suffering from chronic pain. °SEEK MEDICAL CARE IF:  °· Your pain gets worse.   °· You develop a new pain that was not there before.   °· You cannot tolerate medicines given to you by your health care provider.   °· You have new symptoms since your last visit with your health care provider.   °SEEK IMMEDIATE MEDICAL CARE IF:  °· You feel weak.   °· You have decreased sensation or numbness.   °· You lose control of bowel or bladder function.   °· Your pain suddenly gets much worse.   °· You develop shaking. °· You develop chills. °· You develop confusion. °· You develop chest pain. °· You develop shortness of breath.   °MAKE SURE YOU: °· Understand these instructions. °· Will watch your condition. °· Will get help right away if you are not doing well or get worse. °Document Released: 08/14/2002 Document Revised: 07/25/2013 Document Reviewed: 05/18/2013 °Exit[employer]® Patient Information ©2015 [employer]. This information is not intended to replace advice given to you by your health care provider. Make sure you discuss any questions you have with your health care provider. ° °

## 2015-02-13 NOTE — ED Provider Notes (Signed)
CSN: [id number]     Arrival date & time 02/13/15  1432 History   First MD Initiated Contact with Patient 02/13/15 1556     Chief Complaint  Patient presents with  . Back Pain   (Consider location/radiation/quality/duration/timing/severity/associated sxs/prior Treatment) HPI     19 year old female presents with multiple complaints. She reports back pain along her entire spine for about a year, lower abdominal pain for about a year, bilateral breast enlargement for about 9 months. She has been seen in the emergency department many times for these exact problems. She has had multiple workups including labs which were all normal, and exams which are normal. She has been told to follow-up with primary care but she says this has been impossible up until now because she did not have Medicaid. She was unaware that she could see a primary care doctor without Medicaid insurance. She denies any recent change in her symptoms in the past few months.  Past Medical History  Diagnosis Date  . Medical history non-contributory   . Asthma   . Anxiety    Past Surgical History  Procedure Laterality Date  . No past surgeries     No family history on file. History  Substance Use Topics  . Smoking status: Current Every Day Smoker -- 0.25 packs/day    Types: Cigarettes  . Smokeless tobacco: Not on file  . Alcohol Use: No   OB History    Gravida Para Term Preterm AB TAB SAB Ectopic Multiple Living   0         0     Review of Systems  Constitutional: Negative for fever, chills and unexpected weight change.  Eyes: Negative for visual disturbance.  Respiratory: Negative for cough and shortness of breath.   Cardiovascular: Positive for chest pain (bilateral breast fullness ). Negative for palpitations and leg swelling.  Gastrointestinal: Positive for abdominal pain and abdominal distention. Negative for nausea and vomiting.  Endocrine: Negative for polydipsia and polyuria.  Genitourinary: Negative for  dysuria, urgency and frequency.  Musculoskeletal: Positive for back pain. Negative for myalgias and arthralgias.  Skin: Positive for color change (thinks breasts are larger, darker, and more vascular than they used to be). Negative for rash.  Neurological: Negative for dizziness, weakness and light-headedness.  All other systems reviewed and are negative.   Allergies  Review of patient's allergies indicates no known allergies.  Home Medications   Prior to Admission medications   Medication Sig Start Date End Date Taking? Authorizing Provider  famotidine (PEPCID) 20 MG tablet Take 1 tablet (20 mg total) by mouth 2 (two) times daily. Patient not taking: Reported on 11/05/2014 09/28/14   Marlon Pel, PA-C  Multiple Vitamin (MULTIVITAMIN WITH MINERALS) TABS tablet Take 1 tablet by mouth daily.    Historical Provider, MD  naproxen (NAPROSYN) 500 MG tablet Take 1 tablet (500 mg total) by mouth 2 (two) times daily as needed for moderate pain. 11/05/14   Fayrene Helper, PA-C   BP 109/68 mmHg  Pulse 74  Temp(Src) 98.6 F (37 C) (Oral)  Resp 14  SpO2 100%  LMP 01/15/2015 Physical Exam  Constitutional: She is oriented to person, place, and time. Vital signs are normal. She appears well-developed and well-nourished. No distress.  HENT:  Head: Normocephalic and atraumatic.  Pulmonary/Chest: Effort normal. No respiratory distress.  Neurological: She is alert and oriented to person, place, and time. She has normal strength. Coordination normal.  Skin: Skin is warm and dry. No rash noted. She is not  diaphoretic.  Psychiatric: She has a normal mood and affect. Judgment normal.  Nursing note and vitals reviewed.   ED Course  Procedures (including critical care time) Labs Review Labs Reviewed  POCT PREGNANCY, URINE    Imaging Review No results found.   MDM   1. Chronic back pain   2. Chronic abdominal pain   3. Chronic breast pain    Had a long discussion with this patient about her  symptoms and how she needs to see a primary care provider. We went over how she is going to do this and I answered all of her questions. She will follow-up with primary care. No acute condition at this time      Graylon GoodZachary H Taggart Prasad, PA-C 02/13/15 [id number]

## 2015-10-16 IMAGING — CR DG ABDOMEN 2V
3 series · 3 of 3 positions shown · non-contrast
Comparison: None.

CLINICAL DATA: Abdominal pain x months, urinary frequency,
increased appetite

EXAM:
ABDOMEN - 2 VIEW

[w abdomen upright]
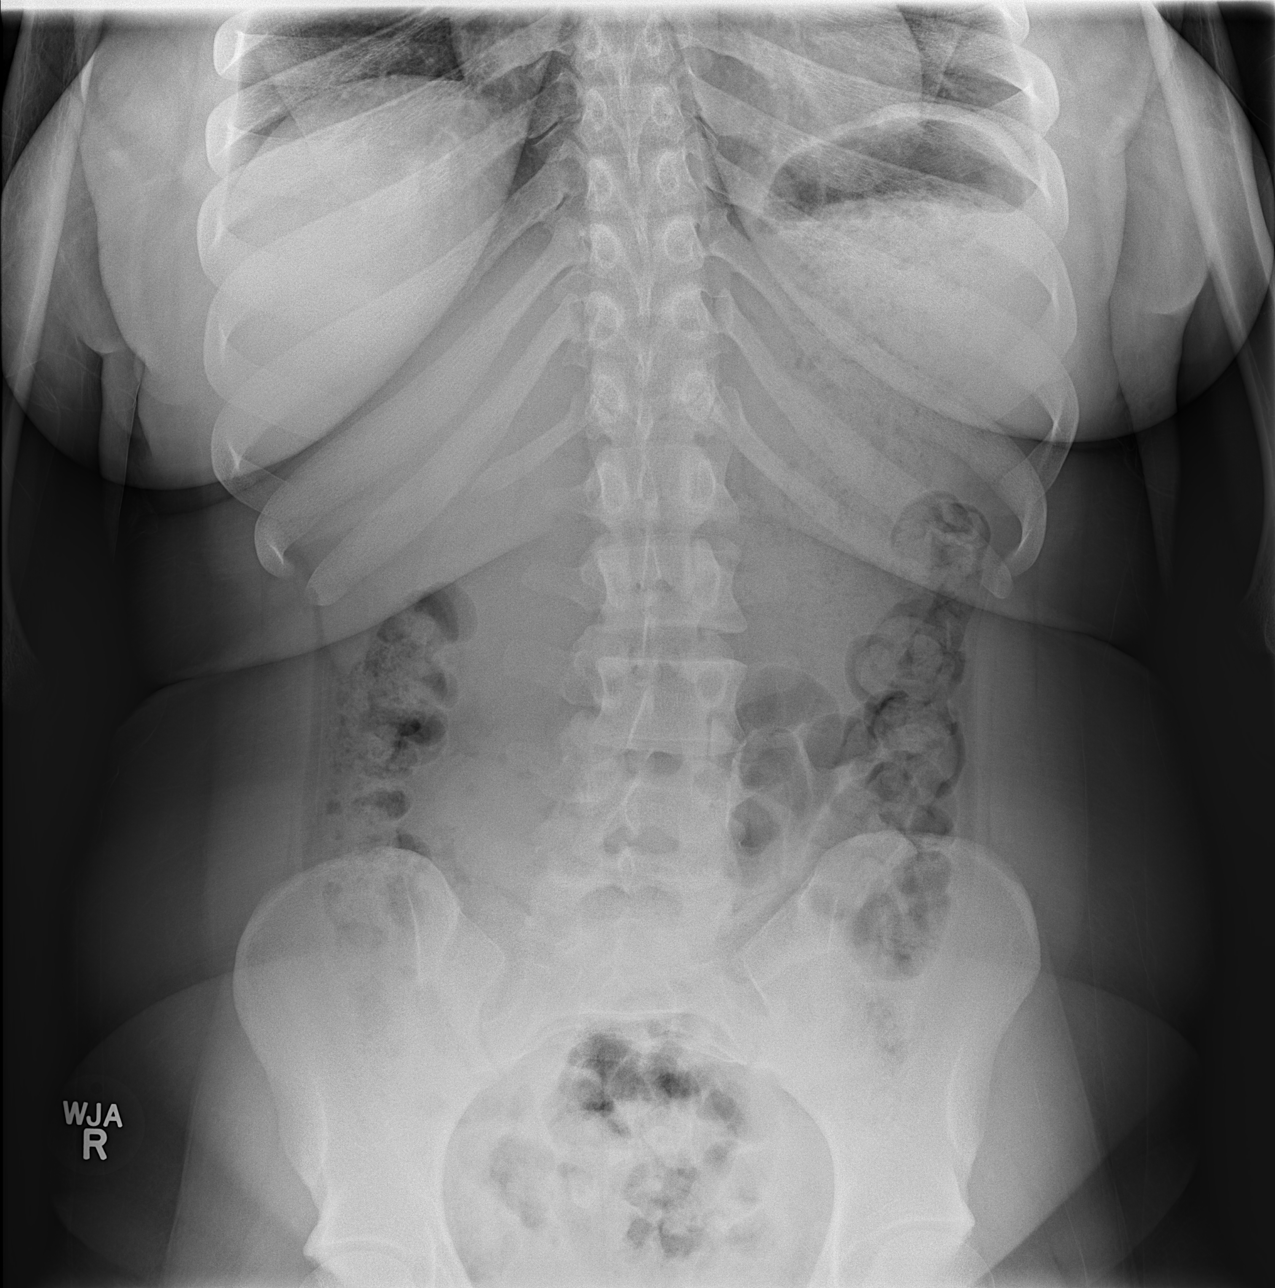

[t abdomen supine (1 of 2)]
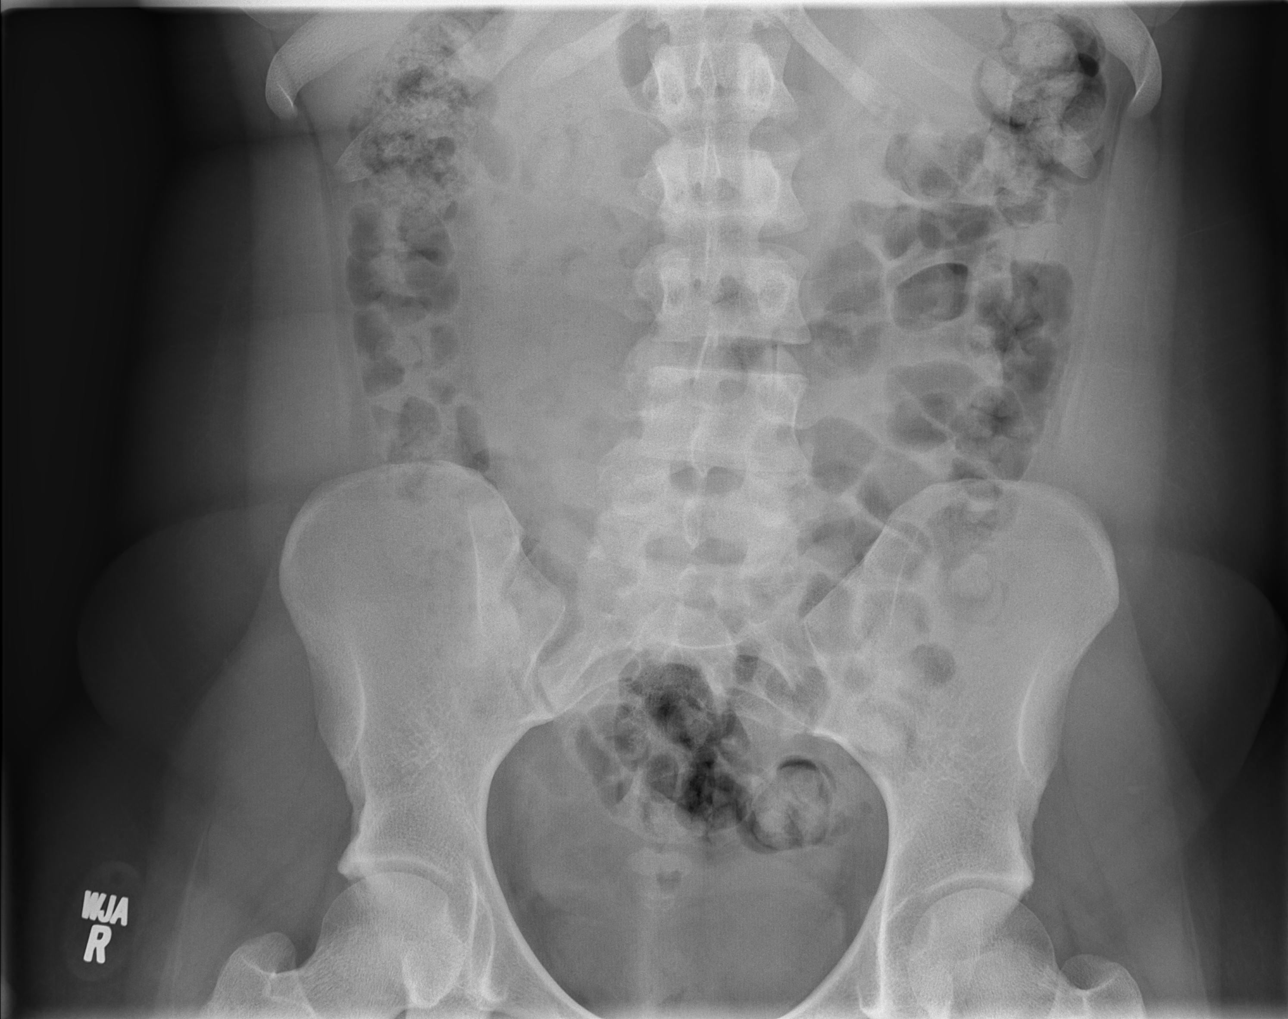

[t abdomen supine (2 of 2)]
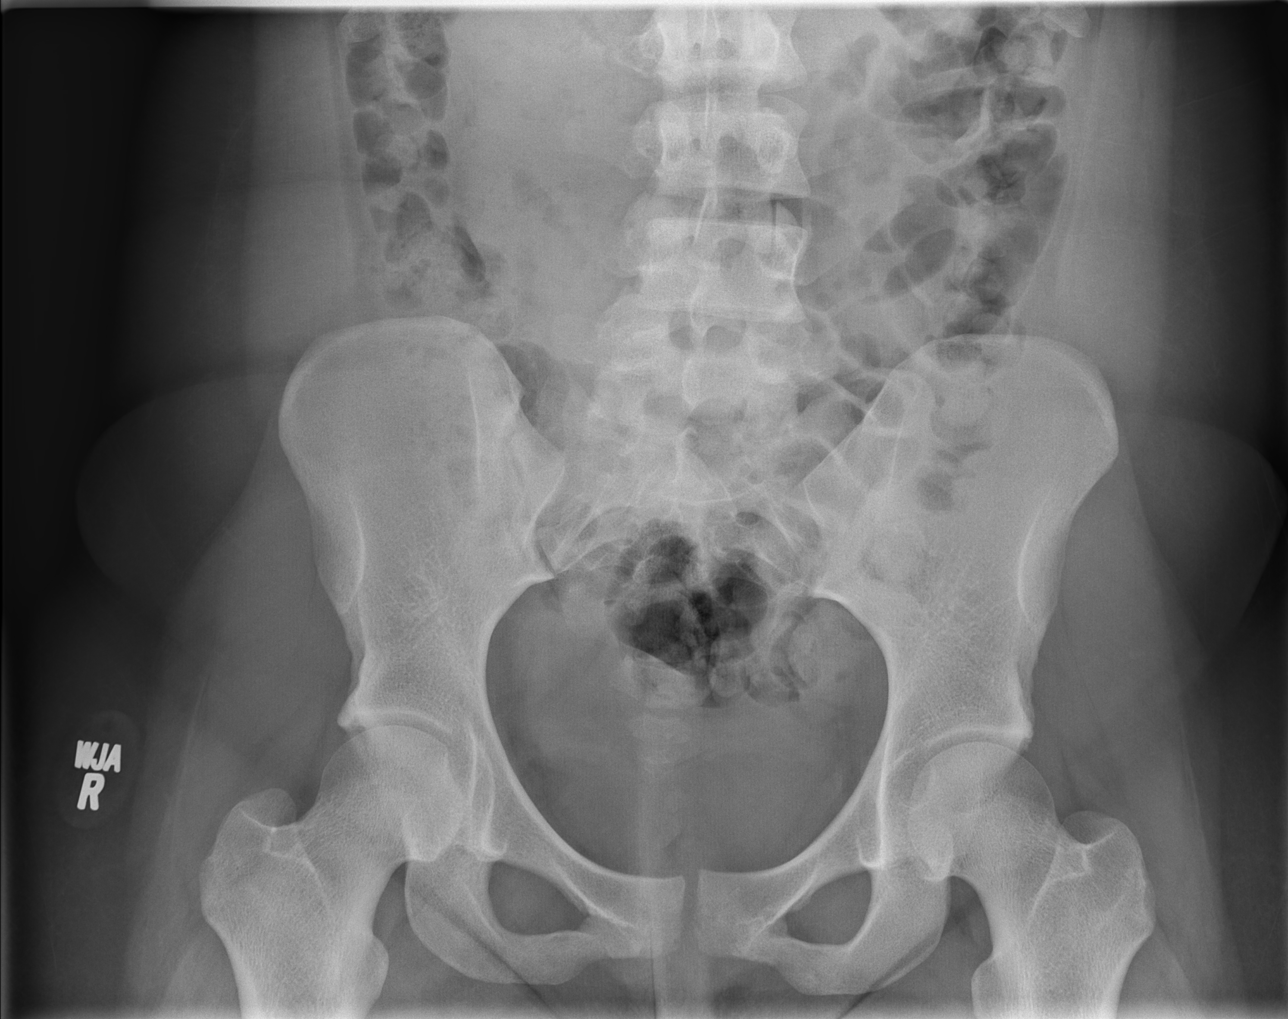

[3 of 3 positions shown; findings below may reference images not displayed]

FINDINGS: Nonobstructive bowel gas pattern. Mild to moderate colonic stool
burden.

No evidence of free air under the diaphragm on the upright view.

Visualized osseous structures are within normal limits.
IMPRESSION: No evidence of small bowel obstruction or free air.
